# Patient Record
Sex: Male | Born: 1950 | Race: White | Hispanic: No | Marital: Married | Smoking: Current every day smoker
Health system: Southern US, Community
[De-identification: ages and names within clinical notes are randomized; demographics above are authoritative.]

## PROBLEM LIST (undated history)

## (undated) DIAGNOSIS — I10 Essential (primary) hypertension: Secondary | ICD-10-CM

## (undated) DIAGNOSIS — I219 Acute myocardial infarction, unspecified: Secondary | ICD-10-CM

## (undated) DIAGNOSIS — Z5189 Encounter for other specified aftercare: Secondary | ICD-10-CM

---

## 2016-02-27 ENCOUNTER — Emergency Department (HOSPITAL_COMMUNITY): Payer: Medicare Other

## 2016-02-27 ENCOUNTER — Encounter (HOSPITAL_COMMUNITY): Payer: Self-pay | Admitting: *Deleted

## 2016-02-27 ENCOUNTER — Inpatient Hospital Stay (HOSPITAL_COMMUNITY)
Admission: EM | Admit: 2016-02-27 | Discharge: 2016-03-29 | DRG: 393 | Disposition: E | Payer: Medicare Other | Attending: Family Medicine | Admitting: Family Medicine

## 2016-02-27 DIAGNOSIS — D500 Iron deficiency anemia secondary to blood loss (chronic): Secondary | ICD-10-CM | POA: Diagnosis present

## 2016-02-27 DIAGNOSIS — R112 Nausea with vomiting, unspecified: Secondary | ICD-10-CM | POA: Diagnosis present

## 2016-02-27 DIAGNOSIS — Z66 Do not resuscitate: Secondary | ICD-10-CM | POA: Diagnosis present

## 2016-02-27 DIAGNOSIS — J9621 Acute and chronic respiratory failure with hypoxia: Secondary | ICD-10-CM | POA: Diagnosis present

## 2016-02-27 DIAGNOSIS — R198 Other specified symptoms and signs involving the digestive system and abdomen: Secondary | ICD-10-CM

## 2016-02-27 DIAGNOSIS — K559 Vascular disorder of intestine, unspecified: Secondary | ICD-10-CM | POA: Diagnosis present

## 2016-02-27 DIAGNOSIS — F329 Major depressive disorder, single episode, unspecified: Secondary | ICD-10-CM | POA: Diagnosis present

## 2016-02-27 DIAGNOSIS — N183 Chronic kidney disease, stage 3 unspecified: Secondary | ICD-10-CM | POA: Diagnosis present

## 2016-02-27 DIAGNOSIS — R680 Hypothermia, not associated with low environmental temperature: Secondary | ICD-10-CM | POA: Diagnosis present

## 2016-02-27 DIAGNOSIS — K922 Gastrointestinal hemorrhage, unspecified: Secondary | ICD-10-CM | POA: Diagnosis present

## 2016-02-27 DIAGNOSIS — K921 Melena: Secondary | ICD-10-CM | POA: Diagnosis present

## 2016-02-27 DIAGNOSIS — R7989 Other specified abnormal findings of blood chemistry: Secondary | ICD-10-CM

## 2016-02-27 DIAGNOSIS — F29 Unspecified psychosis not due to a substance or known physiological condition: Secondary | ICD-10-CM | POA: Diagnosis present

## 2016-02-27 DIAGNOSIS — E871 Hypo-osmolality and hyponatremia: Secondary | ICD-10-CM | POA: Diagnosis present

## 2016-02-27 DIAGNOSIS — I469 Cardiac arrest, cause unspecified: Secondary | ICD-10-CM | POA: Diagnosis not present

## 2016-02-27 DIAGNOSIS — I129 Hypertensive chronic kidney disease with stage 1 through stage 4 chronic kidney disease, or unspecified chronic kidney disease: Secondary | ICD-10-CM | POA: Diagnosis present

## 2016-02-27 DIAGNOSIS — E872 Acidosis, unspecified: Secondary | ICD-10-CM | POA: Diagnosis present

## 2016-02-27 DIAGNOSIS — N179 Acute kidney failure, unspecified: Secondary | ICD-10-CM | POA: Diagnosis present

## 2016-02-27 DIAGNOSIS — E861 Hypovolemia: Secondary | ICD-10-CM

## 2016-02-27 DIAGNOSIS — R231 Pallor: Secondary | ICD-10-CM | POA: Diagnosis present

## 2016-02-27 DIAGNOSIS — I252 Old myocardial infarction: Secondary | ICD-10-CM

## 2016-02-27 DIAGNOSIS — R140 Abdominal distension (gaseous): Secondary | ICD-10-CM | POA: Diagnosis not present

## 2016-02-27 DIAGNOSIS — I959 Hypotension, unspecified: Secondary | ICD-10-CM | POA: Diagnosis present

## 2016-02-27 DIAGNOSIS — R571 Hypovolemic shock: Secondary | ICD-10-CM | POA: Diagnosis present

## 2016-02-27 DIAGNOSIS — R1084 Generalized abdominal pain: Secondary | ICD-10-CM | POA: Insufficient documentation

## 2016-02-27 DIAGNOSIS — Z6826 Body mass index (BMI) 26.0-26.9, adult: Secondary | ICD-10-CM

## 2016-02-27 DIAGNOSIS — R0602 Shortness of breath: Secondary | ICD-10-CM | POA: Diagnosis present

## 2016-02-27 DIAGNOSIS — K920 Hematemesis: Secondary | ICD-10-CM | POA: Diagnosis present

## 2016-02-27 DIAGNOSIS — Z888 Allergy status to other drugs, medicaments and biological substances status: Secondary | ICD-10-CM

## 2016-02-27 DIAGNOSIS — E162 Hypoglycemia, unspecified: Secondary | ICD-10-CM | POA: Diagnosis not present

## 2016-02-27 DIAGNOSIS — R739 Hyperglycemia, unspecified: Secondary | ICD-10-CM | POA: Diagnosis present

## 2016-02-27 DIAGNOSIS — Z8679 Personal history of other diseases of the circulatory system: Secondary | ICD-10-CM

## 2016-02-27 DIAGNOSIS — Z9981 Dependence on supplemental oxygen: Secondary | ICD-10-CM

## 2016-02-27 DIAGNOSIS — K631 Perforation of intestine (nontraumatic): Principal | ICD-10-CM | POA: Diagnosis present

## 2016-02-27 DIAGNOSIS — Z79899 Other long term (current) drug therapy: Secondary | ICD-10-CM

## 2016-02-27 DIAGNOSIS — J449 Chronic obstructive pulmonary disease, unspecified: Secondary | ICD-10-CM | POA: Diagnosis present

## 2016-02-27 DIAGNOSIS — F17210 Nicotine dependence, cigarettes, uncomplicated: Secondary | ICD-10-CM | POA: Diagnosis present

## 2016-02-27 DIAGNOSIS — G43909 Migraine, unspecified, not intractable, without status migrainosus: Secondary | ICD-10-CM | POA: Diagnosis present

## 2016-02-27 DIAGNOSIS — D62 Acute posthemorrhagic anemia: Secondary | ICD-10-CM | POA: Diagnosis present

## 2016-02-27 DIAGNOSIS — A419 Sepsis, unspecified organism: Secondary | ICD-10-CM | POA: Diagnosis present

## 2016-02-27 DIAGNOSIS — R945 Abnormal results of liver function studies: Secondary | ICD-10-CM

## 2016-02-27 DIAGNOSIS — E875 Hyperkalemia: Secondary | ICD-10-CM | POA: Diagnosis present

## 2016-02-27 DIAGNOSIS — E669 Obesity, unspecified: Secondary | ICD-10-CM | POA: Diagnosis present

## 2016-02-27 DIAGNOSIS — N289 Disorder of kidney and ureter, unspecified: Secondary | ICD-10-CM

## 2016-02-27 DIAGNOSIS — F419 Anxiety disorder, unspecified: Secondary | ICD-10-CM | POA: Diagnosis present

## 2016-02-27 DIAGNOSIS — N189 Chronic kidney disease, unspecified: Secondary | ICD-10-CM

## 2016-02-27 DIAGNOSIS — R000 Tachycardia, unspecified: Secondary | ICD-10-CM | POA: Diagnosis present

## 2016-02-27 DIAGNOSIS — K659 Peritonitis, unspecified: Secondary | ICD-10-CM | POA: Diagnosis present

## 2016-02-27 HISTORY — DX: Essential (primary) hypertension: I10

## 2016-02-27 HISTORY — DX: Acute myocardial infarction, unspecified: I21.9

## 2016-02-27 HISTORY — DX: Encounter for other specified aftercare: Z51.89

## 2016-02-27 LAB — CBC
HEMATOCRIT: 35.2 % — AB (ref 39.0–52.0)
Hemoglobin: 11.6 g/dL — ABNORMAL LOW (ref 13.0–17.0)
MCH: 29.9 pg (ref 26.0–34.0)
MCHC: 33 g/dL (ref 30.0–36.0)
MCV: 90.7 fL (ref 78.0–100.0)
Platelets: 334 10*3/uL (ref 150–400)
RBC: 3.88 MIL/uL — ABNORMAL LOW (ref 4.22–5.81)
RDW: 13.1 % (ref 11.5–15.5)
WBC: 18.2 10*3/uL — ABNORMAL HIGH (ref 4.0–10.5)

## 2016-02-27 LAB — COMPREHENSIVE METABOLIC PANEL
ALK PHOS: 131 U/L — AB (ref 38–126)
ALT: 13 U/L — AB (ref 17–63)
AST: 27 U/L (ref 15–41)
Albumin: 2.9 g/dL — ABNORMAL LOW (ref 3.5–5.0)
Anion gap: 27 — ABNORMAL HIGH (ref 5–15)
BILIRUBIN TOTAL: 0.4 mg/dL (ref 0.3–1.2)
BUN: 43 mg/dL — ABNORMAL HIGH (ref 6–20)
CALCIUM: 11.8 mg/dL — AB (ref 8.9–10.3)
CHLORIDE: 91 mmol/L — AB (ref 101–111)
CO2: 10 mmol/L — ABNORMAL LOW (ref 22–32)
CREATININE: 4.07 mg/dL — AB (ref 0.61–1.24)
GFR, EST AFRICAN AMERICAN: 17 mL/min — AB (ref >60)
GFR, EST NON AFRICAN AMERICAN: 14 mL/min — AB (ref >60)
Glucose, Bld: 234 mg/dL — ABNORMAL HIGH (ref 65–99)
Potassium: 4.2 mmol/L (ref 3.5–5.1)
Sodium: 128 mmol/L — ABNORMAL LOW (ref 135–145)
TOTAL PROTEIN: 6.9 g/dL (ref 6.5–8.1)

## 2016-02-27 LAB — I-STAT CHEM 8, ED
BUN: 38 mg/dL — ABNORMAL HIGH (ref 6–20)
CALCIUM ION: 1.49 mmol/L — AB (ref 1.15–1.40)
Chloride: 96 mmol/L — ABNORMAL LOW (ref 101–111)
Creatinine, Ser: 4.1 mg/dL — ABNORMAL HIGH (ref 0.61–1.24)
GLUCOSE: 219 mg/dL — AB (ref 65–99)
HCT: 36 % — ABNORMAL LOW (ref 39.0–52.0)
HEMOGLOBIN: 12.2 g/dL — AB (ref 13.0–17.0)
Potassium: 4.2 mmol/L (ref 3.5–5.1)
Sodium: 127 mmol/L — ABNORMAL LOW (ref 135–145)
TCO2: 12 mmol/L (ref 0–100)

## 2016-02-27 LAB — POCT I-STAT 4, (NA,K, GLUC, HGB,HCT)
Glucose, Bld: 222 mg/dL — ABNORMAL HIGH (ref 65–99)
HCT: 42 % (ref 39.0–52.0)
Hemoglobin: 14.3 g/dL (ref 13.0–17.0)
Potassium: 5.9 mmol/L — ABNORMAL HIGH (ref 3.5–5.1)
SODIUM: 125 mmol/L — AB (ref 135–145)

## 2016-02-27 LAB — TROPONIN I

## 2016-02-27 LAB — APTT: APTT: 37 s — AB (ref 24–36)

## 2016-02-27 LAB — I-STAT TROPONIN, ED: Troponin i, poc: 0 ng/mL (ref 0.00–0.08)

## 2016-02-27 MED ORDER — SODIUM CHLORIDE 0.9 % IV SOLN: INTRAVENOUS | Status: DC

## 2016-02-27 MED ORDER — ACETAMINOPHEN 325 MG PO TABS: 650.0000 mg | ORAL_TABLET | Freq: Four times a day (QID) | ORAL | Status: DC | PRN

## 2016-02-27 MED ORDER — SODIUM BICARBONATE 8.4 % IV SOLN
INTRAVENOUS | Status: DC
  Administered 2016-02-27 – 2016-02-28 (×2): via INTRAVENOUS
  Filled 2016-02-27 (×5): qty 1000

## 2016-02-27 MED ORDER — SODIUM CHLORIDE 0.9 % IV BOLUS (SEPSIS)
1000.0000 mL | Freq: Once | INTRAVENOUS | Status: AC
  Administered 2016-02-27: 1000 mL via INTRAVENOUS

## 2016-02-27 MED ORDER — MORPHINE SULFATE (PF) 2 MG/ML IV SOLN
1.0000 mg | INTRAVENOUS | Status: DC | PRN
  Administered 2016-02-27: 1 mg via INTRAVENOUS
  Filled 2016-02-27: qty 1

## 2016-02-27 MED ORDER — OLANZAPINE 5 MG PO TABS
10.0000 mg | ORAL_TABLET | Freq: Two times a day (BID) | ORAL | Status: DC
  Administered 2016-02-27: 10 mg via ORAL
  Filled 2016-02-27: qty 2

## 2016-02-27 MED ORDER — ALPRAZOLAM 0.5 MG PO TABS
0.5000 mg | ORAL_TABLET | Freq: Two times a day (BID) | ORAL | Status: DC | PRN
  Administered 2016-02-28: 0.5 mg via ORAL
  Filled 2016-02-27: qty 1

## 2016-02-27 MED ORDER — PIPERACILLIN-TAZOBACTAM 3.375 G IVPB
3.3750 g | Freq: Three times a day (TID) | INTRAVENOUS | Status: DC
  Administered 2016-02-27 – 2016-02-28 (×2): 3.375 g via INTRAVENOUS
  Filled 2016-02-27 (×2): qty 50

## 2016-02-27 MED ORDER — VANCOMYCIN HCL IN DEXTROSE 1-5 GM/200ML-% IV SOLN
1000.0000 mg | INTRAVENOUS | Status: DC
  Administered 2016-02-27: 1000 mg via INTRAVENOUS
  Filled 2016-02-27: qty 200

## 2016-02-27 MED ORDER — BUSPIRONE HCL 5 MG PO TABS
30.0000 mg | ORAL_TABLET | Freq: Two times a day (BID) | ORAL | Status: DC
  Administered 2016-02-27: 30 mg via ORAL
  Filled 2016-02-27: qty 6

## 2016-02-27 MED ORDER — FAMOTIDINE IN NACL 20-0.9 MG/50ML-% IV SOLN
20.0000 mg | Freq: Two times a day (BID) | INTRAVENOUS | Status: DC
  Administered 2016-02-27: 20 mg via INTRAVENOUS
  Filled 2016-02-27: qty 50

## 2016-02-27 MED ORDER — SODIUM BICARBONATE 8.4 % IV SOLN
INTRAVENOUS | Status: AC
  Filled 2016-02-27: qty 150

## 2016-02-27 MED ORDER — ACETAMINOPHEN 650 MG RE SUPP: 650.0000 mg | Freq: Four times a day (QID) | RECTAL | Status: DC | PRN

## 2016-02-27 NOTE — ED Provider Notes (Signed)
[facility]-EMER[facility]ENCY DEPT Provider Note   CSN: [id number] Arrival date & time: 03/19/2016  1752     History   Chief Complaint Chief Complaint  Patient presents with  . Shortness of Breath    HPI James Delgado is a 65 y.o. male.  HPI  Patient presents via EMS with concerns of weakness, dyspnea. Patient acknowledges multiple medical issues, include a COPD, but cannot specify the others. He states that he uses oxygen at home. Seems as though the past few days the patient has had increasing weakness, without clear precipitant. Patient eventually states that he received a blood transfusion at another facility 2 weeks ago, has been seen there once in the interim, discharged home, in spite of having persistent weakness. Patient initially denies new diarrhea, melena, hematemesis, vomiting, but eventually tells nursing that he may have had bloody emesis recently. He does complain of discomfort throughout his habitus, primarily in his abdomen, head, neck, described as sore. Patient states that he takes all of his medication as directed, though it is unclear what his medication regimen is.   Past Medical History:  Diagnosis Date  . Blood transfusion without reported diagnosis   . Heart attack   . Hypertension     There are no active problems to display for this patient.   History reviewed. No pertinent surgical history.     Home Medications    Prior to Admission medications   Medication Sig Start Date End Date Taking? Authorizing Provider  ALPRAZolam Prudy Feeler) 0.5 MG tablet Take 0.5 mg by mouth 2 (two) times daily as needed. 01/29/16   Historical Provider, MD  amLODipine-benazepril (LOTREL) 5-10 MG capsule Take 1 capsule by mouth daily. 12/29/15   Historical Provider, MD  busPIRone (BUSPAR) 15 MG tablet Take 15 mg by mouth 4 (four) times daily. 02/14/16   Historical Provider, MD  ferrous sulfate 325 (65 FE) MG tablet Take 325 mg by mouth 2 (two) times daily. 02/08/16   Historical Provider, MD   gemfibrozil (LOPID) 600 MG tablet Take 600 mg by mouth 2 (two) times daily. 01/17/16   Historical Provider, MD  HYDROcodone-acetaminophen (NORCO/VICODIN) 5-325 MG tablet Take 1 tablet by mouth 3 (three) times daily as needed. 02/16/16   Historical Provider, MD  OLANZapine (ZYPREXA) 10 MG tablet Take 20 mg by mouth daily. 01/17/16   Historical Provider, MD  propranolol (INDERAL) 10 MG tablet Take 10 mg by mouth 4 (four) times daily. 12/29/15   Historical Provider, MD  tiZANidine (ZANAFLEX) 4 MG tablet Take 4 mg by mouth 2 (two) times daily as needed. 12/29/15   Historical Provider, MD  traMADol (ULTRAM) 50 MG tablet Take 1 tablet by mouth 3 (three) times daily as needed. 02/04/16   Historical Provider, MD    Family History No family history on file.  Social History Social History  Substance Use Topics  . Smoking status: Current Every Day Smoker    Packs/day: 1.00    Types: Cigarettes  . Smokeless tobacco: Not on file  . Alcohol use No     Allergies   Review of patient's allergies indicates no known allergies.   Review of Systems Review of Systems  Constitutional:       Per HPI, otherwise negative  HENT:       Per HPI, otherwise negative  Respiratory:       Per HPI, otherwise negative  Cardiovascular:       Per HPI, otherwise negative  Gastrointestinal: Positive for nausea and vomiting. Negative for anal bleeding and  blood in stool.  Endocrine:       Negative aside from HPI  Genitourinary:       Neg aside from HPI   Musculoskeletal:       Per HPI, otherwise negative  Skin: Positive for pallor.  Neurological: Positive for weakness. Negative for syncope.     Physical Exam Updated Vital Signs BP (!) 69/40 (BP Location: Right Arm)   Pulse 109   Resp (!) 32   Ht 6\' 1"  (1.854 m)   Wt 195 lb (88.5 kg)   BMI 25.73 kg/m   Physical Exam  Constitutional: He is oriented to person, place, and time. He appears distressed.  Unwell appearing elderly male, obese, sitting upright in  bed, with increased work of breathing  HENT:  Head: Normocephalic and atraumatic.  Eyes: Conjunctivae and EOM are normal.  Cardiovascular: Regular rhythm.  Tachycardia present.   Pulmonary/Chest: No stridor. Tachypnea noted. He is in respiratory distress. He has decreased breath sounds.  Abdominal: He exhibits no distension.  Musculoskeletal: He exhibits no edema.  Neurological: He is alert and oriented to person, place, and time. He is not disoriented. He displays no tremor. He exhibits normal muscle tone. He displays no seizure activity.  Skin: Skin is warm. He is diaphoretic. There is pallor.  Psychiatric: He has a normal mood and affect.  Nursing note and vitals reviewed.    ED Treatments / Results  Labs (all labs ordered are listed, but only abnormal results are displayed) Labs Reviewed  CBC - Abnormal; Notable for the following:       Result Value   WBC 18.2 (*)    RBC 3.88 (*)    Hemoglobin 11.6 (*)    HCT 35.2 (*)    All other components within normal limits  APTT - Abnormal; Notable for the following:    aPTT 37 (*)    All other components within normal limits  I-STAT CHEM 8, ED - Abnormal; Notable for the following:    Sodium 127 (*)    Chloride 96 (*)    BUN 38 (*)    Creatinine, Ser 4.10 (*)    Glucose, Bld 219 (*)    Calcium, Ion 1.49 (*)    Hemoglobin 12.2 (*)    HCT 36.0 (*)    All other components within normal limits  POCT I-STAT 4, (NA,K, GLUC, HGB,HCT) - Abnormal; Notable for the following:    Sodium 125 (*)    Potassium 5.9 (*)    Glucose, Bld 222 (*)    All other components within normal limits  COMPREHENSIVE METABOLIC PANEL  TROPONIN I  I-STAT TROPOININ, ED    EKG  EKG Interpretation  Date/Time:  Sunday February 27 2016 17:54:36 EDT Ventricular Rate:  112 PR Interval:    QRS Duration: 106 QT Interval:  343 QTC Calculation: 469 R Axis:   115 Text Interpretation:  Sinus tachycardia Consider left atrial enlargement T wave abnormality  Non-specific intra-ventricular conduction delay Artifact Abnormal ekg Confirmed by Gerhard MunchLOCKWOOD, Kathy Wahid  MD [phone]) on 03/23/2016 6:21:48 PM       Radiology Dg Chest Port 1 View  Result Date: 03/18/2016 CLINICAL DATA:  Chest pain EXAM: PORTABLE CHEST 1 VIEW COMPARISON:  02/21/2016 FINDINGS: Cardiac shadow is mildly accentuated by the portable technique. Thickening of the minor fissure is noted on the right consistent with a small amount of pleural fluid. There is likely some right basilar atelectasis present as well. These changes are new from the prior exam. No other  focal abnormality is seen. IMPRESSION: New right basilar atelectasis and thickening of the minor fissure. Electronically Signed   By: Alcide Clever M.D.   On: 03/15/2016 18:30    Procedures Procedures (including critical care time)  Medications Ordered in ED Medications  sodium chloride 0.9 % bolus 1,000 mL (1,000 mLs Intravenous New Bag/Given 2016-03-15 1813)     Initial Impression / Assessment and Plan / ED Course  I have reviewed the triage vital signs and the nursing notes.  Pertinent labs & imaging results that were available during my care of the patient were reviewed by me and considered in my medical decision making (see chart for details).  Clinical Course    Initial blood pressure 60/40, fluids started, supplemental oxygen and continued. Attempting to receive records from the patient's other facility.  7:34 PM Blood pressure now substantially improved, 100 systolic. Initial labs notable for renal dysfunction, creatinine of 4, potassium of 5.9, calcium of 12 Patient has no history of renal disease, that he acknowledges. Patient has received IV fluids empirically, will continue to do so.  On repeat exam the patient's color is substantially better, pallor has resolved in his face.  8:26 PM Hospitals records from 4 days ago during evaluation at another facility demonstrate creatinine 1.5, substantiating new renal  insufficiency.   Final Clinical Impressions(s) / ED Diagnoses  Elderly male with multiple medical issues presents with increased work of breathing, describes weakness, fatigue, diffuse discomfort. Patient is initially notably hypotensive, 60/40, pale, diaphoretic, and there is concern for hypovolemic shock, unclear etiology. patient is also hypothermic. Patient received immediate resuscitation with IV fluids, warming blankets. Patient had substantial improvement in his vital signs. However, the patient was unable multiple critical lab abnormalities including creatinine of 4, potassium of greater than 5. Patient required admission for further evaluation, management.  CRITICAL CARE Performed by: Gerhard Munch Total critical care time: 45 minutes Critical care time was exclusive of separately billable procedures and treating other patients. Critical care was necessary to treat or prevent imminent or life-threatening deterioration. Critical care was time spent personally by me on the following activities: development of treatment plan with patient and/or surrogate as well as nursing, discussions with consultants, evaluation of patient's response to treatment, examination of patient, obtaining history from patient or surrogate, ordering and performing treatments and interventions, ordering and review of laboratory studies, ordering and review of radiographic studies, pulse oximetry and re-evaluation of patient's condition.    Gerhard Munch, MD 03-15-2016 2027

## 2016-02-27 NOTE — H&P (Signed)
[facility] History and Physical  James Delgado ZOX:[id number] DOB: [birth year] DOA: March 01, 2016  Referring physician: Dr. Jeraldine Loots, [facility] ED PCP: No primary care provider on file.   Chief Complaint: Dyspnea  HPI: Treyce Spillers is a 65 y.o. male called EMS today and found sitting on floor , O2 sat was 86% improved on 2L [place] O2.  Was at [facility] last week and was told he had a "light heart attack " and a "light stroke".  STill having SOB and headaches, came to [facility] for another opinion.    Notes from [facility] show admit on 9/5- 02/05/16 for anemia, prerenal azotemia, hypoNa+ and hyperkalemia, improved w IVF's and blood transfusion. Cr 2.6 > 1.49. Hb 7.1 > 8.9 after 2u prbc/   Then admitted 9/25 - 02/22/16 for chest pain rule out.  Had neg trop x 3 and normal EKG, WBC 12k, Creat 1.5.  DC'd , to have outpatient stress test.    Now here with anemia, hypoNa+, acute renal failure w creat 4 and Ca 11.8.    Patient says he has been vomiting for 2 weeks.  Vomitus is "black" and smells bad.  No bloody stool or BRB w emesis.  He has brown liquid stained cheeks from vomiting.  Has chest pain and abd pain, "all over" from his throat to his lower abd in the midline.  He smokes, denies any nsaid use.  Has been taking Tums antacid pills about 6 / day usually but this past two weeks has been taking double.  Denies any hx of ulcers or polyps.    Patient lives w his wife of 30 yrs in [place], [place].  Pt went to [employer], didn't finish , went to work in [employer] x 15 yrs.  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  Never had children.  Has Medicare and Medicaid and one more insurance policy.  NO problem getting medications.  Quit ETOH 27 yrs ago, smokes actively.  Had 4 nervous breakdowns, spent 3 mos at [facility] for each, the last was about 1996.  Takes Zyprexa for this and Xanax prn.  No family here today.  Denies fevers, chills, HA, no hx dysurnia or hematuria, no hx kidney stones.  NO hx  ulcers.  No etoh.    Home meds > xanax, amlod/ benazepril, Buspar, feSO4, Lopid, Norco, Zyprexa, Inderal, ULtram   Past Medical History  Past Medical History:  Diagnosis Date  . Blood transfusion without reported diagnosis   . Heart attack   . Hypertension    Past Surgical History History reviewed. No pertinent surgical history. Family History History reviewed. No pertinent family history. Social History  reports that he has been smoking Cigarettes.  He has been smoking about 1.00 pack per day. He has never used smokeless tobacco. He reports that he does not drink alcohol or use drugs. Allergies  Allergies  Allergen Reactions  . Seroquel [Quetiapine Fumarate] Other (See Comments)    Patient has hallucinations   Home medications Prior to Admission medications   Medication Sig Start Date End Date Taking? Authorizing Provider  ALPRAZolam Prudy Feeler) 0.5 MG tablet Take 0.5 mg by mouth 2 (two) times daily as needed. 01/29/16   Historical Provider, MD  amLODipine-benazepril (LOTREL) 5-10 MG capsule Take 1 capsule by mouth daily. 12/29/15   Historical Provider, MD  busPIRone (BUSPAR) 15 MG tablet Take 15 mg by mouth 4 (four) times daily. 02/14/16   Historical Provider, MD  ferrous sulfate 325 (65 FE) MG  tablet Take 325 mg by mouth 2 (two) times daily. 02/08/16   Historical Provider, MD  gemfibrozil (LOPID) 600 MG tablet Take 600 mg by mouth 2 (two) times daily. 01/17/16   Historical Provider, MD  HYDROcodone-acetaminophen (NORCO/VICODIN) 5-325 MG tablet Take 1 tablet by mouth 3 (three) times daily as needed. 02/16/16   Historical Provider, MD  OLANZapine (ZYPREXA) 10 MG tablet Take 20 mg by mouth daily. 01/17/16   Historical Provider, MD  traMADol (ULTRAM) 50 MG tablet Take 1 tablet by mouth 3 (three) times daily as needed. 02/04/16   Historical Provider, MD   Liver Function Tests  Recent Labs Lab 03/19/2016 1837  AST 27  ALT 13*  ALKPHOS 131*  BILITOT 0.4  PROT 6.9  ALBUMIN 2.9*   No results  for input(s): LIPASE, AMYLASE in the last 168 hours. CBC  Recent Labs Lab 03/19/2016 1825 03/22/2016 1837 03/10/2016 1913  WBC  --  18.2*  --   HGB 14.3 11.6* 12.2*  HCT 42.0 35.2* 36.0*  MCV  --  90.7  --   PLT  --  334  --    Basic Metabolic Panel  Recent Labs Lab 03/28/2016 1825 03/11/2016 1837 03/04/2016 1913  NA 125* 128* 127*  K 5.9* 4.2 4.2  CL  --  91* 96*  CO2  --  10*  --   GLUCOSE 222* 234* 219*  BUN  --  43* 38*  CREATININE  --  4.07* 4.10*  CALCIUM  --  11.8*  --      Vitals:   03/25/2016 1921 03/28/2016 2015 03/17/2016 2040 03/15/2016 2042  BP: 129/95 123/87  133/77  Pulse: 94 101 78 95  Resp: 22 23 (!) 33 25  Temp:  (!) 96.2 F (35.7 C)    TempSrc:  Rectal    SpO2: 99% 99% 95% 100%  Weight:      Height:       Exam: Gen pale older adult male, alert, no distress, looks dry and pale No rash, cyanosis or gangrene Sclera anicteric, throat is quite dry, + with stained cheeks from vomiting coffee ground emesis No jvd or bruits Chest clear bilat RRR no MRG Abd soft ntnd no mass or ascites +bs, no hsm GU normal male MS no joint effusions or deformity Ext no LE or UE edema / no wounds or ulcers, skin turgor is poor Neuro is alert, Ox 3 , nf   Na 127  K 4.2  Cl 96 CO2 10  BUN 38  Cr 4.07  Ca 11.8 Alb 2.9  Adj Ca 12.6   Tprot 6.9  AST 27 ALT 13  tbili 0.4  Trop < 0.03   WBC 18K   Hb 11.6   plt 334  Glucose 224  EKG (independ reviewed) > Sinus tachycardia  Consider left atrial enlargement T wave abnormality Non-specific intra-ventricular conduction delay CXR (independ reviewed) > New right basilar atelectasis and thickening of the minor fissure.   Assessment: 1. Nausea /vomiting/ coffee ground emesis/ abd pain - not anemic.  Plan admit, start IV PPI, keep on clear liquids and consult GI in am. May need eval of upper tract for ulcer/ gastritis, etc.  Needs SDU admit due to hypotension, give lots of fluids, IV abx after cultures in case of sepsis.   2. Acute renal  failure - due to #4 and #5. Check [place] and UA.  3. CKD - baseline creat is 1.5, CKD 3 4. Vol depletion 5. Hypercalcemia - probably from CaCO3,  taking high doses of CaCO3 for stomach discomfort, less likely malignancy. DC TUms, give lots of saline, f/u Ca levels, further w/u if not improving.  6. Hypotension - doesn't appear septic, but temp is low 96 and WBC is high.  Plan blood and urine cx's and start empiric abx.  CXR is negative , no resp symptoms. 7. Chron psychosis/ anxiety - cont Zyprexa, prn xanax, BUspar 8. Migraines - on inderal 9. HTN - on CCB/ ACEi combo, will hold both 10. Metabolic acidosis - from renal failure, +AG, +ketosis from starvation. Will start bicarb gtt   Plan - as above     Daci Stubbe D [facility] Pager [phone]   If 7PM-7AM, please contact night-coverage [email or web address] Password [email or web address] 03/07/2016, 9:53 PM

## 2016-02-27 NOTE — ED Notes (Signed)
3rd floor called requesting that I give report to the ICU nurse as the pt will be moving to step down unit. This nurse called Shanda BumpsJessica, RN and gave report. Staff on 3rd floor reports that they will transport pt from 3rd floor to ICU if I called report.

## 2016-02-27 NOTE — ED Notes (Signed)
Pt refusing to leave the bear hugger on.

## 2016-02-27 NOTE — H&P (Deleted)
[facility] History and Physical  James Delgado EAV:[id number] DOB: Apr 04, 1951 DOA: 03/10/2016  Referring physician: Dr. Jeraldine Loots, [facility] ED PCP: No primary care provider on file.   Chief Complaint: Dyspnea  HPI: James Delgado is a 65 y.o. male called EMS today and found sitting on floor , O2 sat was 86% improved on 2L [place] O2.  Was at [facility] hosp last week and was told he had a "light heart attack " and a "light stroke".  STill having SOB and headaches, came to [facility] for another opinion.    Notes from [facility] show admit on 9/5- 02/05/16 for anemia, prerenal azotemia, hypoNa+ and hyperkalemia, improved w IVF's and blood transfusion. Cr 2.6 > 1.49. Hb 7.1 > 8.9 after 2u prbc/   Then admitted 9/25 - 02/22/16 for chest pain rule out.  Had neg trop x 3 and normal EKG, WBC 12k, Creat 1.5.  DC'd , to have outpatient stress test.    Now here with anemia, hypoNa+, acute renal failure w creat 4 and Ca 11.8.    Patient says he has been vomiting for 2 weeks.  Vomitus is "black" and smells bad.  No bloody stool or BRB w emesis.  He has brown liquid stained cheeks from vomiting.  Has chest pain and abd pain, "all over" from his throat to his lower abd in the midline.  He smokes, denies any nsaid use.  Has been taking Tums antacid pills about 6 / day usually but this past two weeks has been taking double.  Denies any hx of ulcers or polyps.    Patient lives w his wife of 30 yrs in [place], [place].  Pt went to [employer], didn't finish , went to work in [employer] x 15 yrs.  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  Never had children.  Has Medicare and Medicaid and one more insurance policy.  NO problem getting medications.  Quit ETOH 27 yrs ago, smokes actively.  Had 4 nervous breakdowns, spent 3 mos at [facility] for each, the last was about 1996.  Takes Zyprexa for this and Xanax prn.  No family here today.  Denies fevers, chills, HA, no hx dysurnia or hematuria, no hx kidney stones.  NO hx  ulcers.  No etoh.    Home meds > xanax, amlod/ benazepril, Buspar, feSO4, Lopid, Norco, Zyprexa, Inderal, ULtram   Past Medical History  Past Medical History:  Diagnosis Date  . Blood transfusion without reported diagnosis   . Heart attack   . Hypertension    Past Surgical History History reviewed. No pertinent surgical history. Family History No family history on file. Social History  reports that he has been smoking Cigarettes.  He has been smoking about 1.00 pack per day. He does not have any smokeless tobacco history on file. He reports that he does not drink alcohol or use drugs. Allergies No Known Allergies Home medications Prior to Admission medications   Medication Sig Start Date End Date Taking? Authorizing Provider  ALPRAZolam Prudy Feeler) 0.5 MG tablet Take 0.5 mg by mouth 2 (two) times daily as needed. 01/29/16   Historical Provider, MD  amLODipine-benazepril (LOTREL) 5-10 MG capsule Take 1 capsule by mouth daily. 12/29/15   Historical Provider, MD  busPIRone (BUSPAR) 15 MG tablet Take 15 mg by mouth 4 (four) times daily. 02/14/16   Historical Provider, MD  ferrous sulfate 325 (65 FE) MG tablet Take 325 mg by mouth 2 (two) times daily. 02/08/16   Historical Provider,  MD  gemfibrozil (LOPID) 600 MG tablet Take 600 mg by mouth 2 (two) times daily. 01/17/16   Historical Provider, MD  HYDROcodone-acetaminophen (NORCO/VICODIN) 5-325 MG tablet Take 1 tablet by mouth 3 (three) times daily as needed. 02/16/16   Historical Provider, MD  OLANZapine (ZYPREXA) 10 MG tablet Take 20 mg by mouth daily. 01/17/16   Historical Provider, MD  traMADol (ULTRAM) 50 MG tablet Take 1 tablet by mouth 3 (three) times daily as needed. 02/04/16   Historical Provider, MD   Liver Function Tests  Recent Labs Lab 03/04/2016 1837  AST 27  ALT 13*  ALKPHOS 131*  BILITOT 0.4  PROT 6.9  ALBUMIN 2.9*   No results for input(s): LIPASE, AMYLASE in the last 168 hours. CBC  Recent Labs Lab 03/17/2016 1825  03/07/2016 1837 03/11/2016 1913  WBC  --  18.2*  --   HGB 14.3 11.6* 12.2*  HCT 42.0 35.2* 36.0*  MCV  --  90.7  --   PLT  --  334  --    Basic Metabolic Panel  Recent Labs Lab 02/29/2016 1825 03/28/2016 1837 03/05/2016 1913  NA 125* 128* 127*  K 5.9* 4.2 4.2  CL  --  91* 96*  CO2  --  10*  --   GLUCOSE 222* 234* 219*  BUN  --  43* 38*  CREATININE  --  4.07* 4.10*  CALCIUM  --  11.8*  --      Vitals:   03/04/2016 1820 03/14/2016 1905 03/03/2016 1915 03/19/2016 1921  BP:  116/73 (!) 98/52 129/95  Pulse:  (!) 139 96 94  Resp:  22 (!) 28 (!) 32  Temp: (S) (!) 96.1 F (35.6 C)     TempSrc: Rectal     SpO2:  96% 99% 99%  Weight:      Height:       Exam: Gen pale older adult male, alert, no distress, looks dry and pale No rash, cyanosis or gangrene Sclera anicteric, throat is quite dry, + with stained cheeks from vomiting coffee ground emesis No jvd or bruits Chest clear bilat RRR no MRG Abd soft ntnd no mass or ascites +bs, no hsm GU normal male MS no joint effusions or deformity Ext no LE or UE edema / no wounds or ulcers, skin turgor is poor Neuro is alert, Ox 3 , nf   Na 127  K 4.2  Cl 96 CO2 10  BUN 38  Cr 4.07  Ca 11.8 Alb 2.9  Adj Ca 12.6   Tprot 6.9  AST 27 ALT 13  tbili 0.4  Trop < 0.03   WBC 18K   Hb 11.6   plt 334  Glucose 224  EKG (independ reviewed) > Sinus tachycardia  Consider left atrial enlargement T wave abnormality Non-specific intra-ventricular conduction delay CXR (independ reviewed) > New right basilar atelectasis and thickening of the minor fissure.   Assessment: 1. Nausea /vomiting/ coffee ground emesis/ abd pain - not anemic.  Plan admit, start IV PPI, keep on clear liquids and consult GI in am. May need eval of upper tract for ulcer/ gastritis, etc.  Needs SDU admit due to hypotension, give lots of fluids, IV abx after cultures in case of sepsis.   2. Acute renal failure - due to #4 and #5. Check US and UA.  3. CKD - baseline creat is 1.5, CKD  3 4. Vol depletion 5. Hypercalcemia - probably from CaCO3, taking high doses of CaCO3 for stomach discomfort, less likely malignancy.  DC TUms, give lots of saline, f/u Ca levels, further w/u if not improving.  6. Hypotension - doesn't appear septic, but temp is low 96 and WBC is high.  Plan blood and urine cx's and start empiric abx.  CXR is negative , no resp symptoms. 7. Chron psychosis/ anxiety - cont Zyprexa, prn xanax, BUspar 8. Migraines - on inderal 9. HTN - on CCB/ ACEi combo, will hold both 10. Metabolic acidosis - from renal failure, +AG, +ketosis from starvation. Will start bicarb gtt   Plan - as above     James Delgado D [facility] Pager [phone]   If 7PM-7AM, please contact night-coverage [email or web address] Password [email or web address] Mar 18, 2016, 8:00 PM

## 2016-02-27 NOTE — ED Triage Notes (Signed)
EMS was called out for SOB. Pt was found to be sitting in the floor messing with his wallet. O2 sat was 86% on RA, EMS placed pt on 2L O2 via [place]. EMS reported no wheezing. Per pt [facility] and was told he had a "light heart attack" and "light stroke". Pt reports his family called EMS because his "heart hurts, short of breath, and both sides of my neck hurt and are swollen".

## 2016-02-27 NOTE — Progress Notes (Signed)
Pharmacy Antibiotic Note  Patricia NettleJames Despain is a 65 y.o. male admitted on 03/16/2016 with sepsis.  Pharmacy has been consulted for vancomycin and Zosyn dosing.  Plan: 1. Vancomycin 1000 mg IV Q24h 2. Zosyn 3.375g IV Q8h infused over 4 hours 3. Will monitor cultures and renal function. Will check vancomycin trough as appropriate.   Height: 6\' 1"  (185.4 cm) Weight: 195 lb (88.5 kg) IBW/kg (Calculated) : 79.9  Temp (24hrs), Avg:96.2 F (35.7 C), Min:96.1 F (35.6 C), Max:96.2 F (35.7 C)   Recent Labs Lab 2015/10/15 1837 2015/10/15 1913  WBC 18.2*  --   CREATININE 4.07* 4.10*    Estimated Creatinine Clearance: 20.8 mL/min (by C-G formula based on SCr of 4.1 mg/dL (H)).    Allergies  Allergen Reactions  . Seroquel [Quetiapine Fumarate] Other (See Comments)    Patient has hallucinations    Antimicrobials this admission: 10/1 vancomycin >>  10/1 Zosyn >>   Dose adjustments this admission: None  Microbiology results: 10/1 UCx:   10/1 MRSA PCR:   Thank you for allowing pharmacy to be a part of this patient's care.  Lenore MannerHolcombe, Alvetta Hidrogo [place] 03/26/2016 11:09 PM

## 2016-02-28 ENCOUNTER — Inpatient Hospital Stay (HOSPITAL_COMMUNITY): Payer: Medicare Other

## 2016-02-28 DIAGNOSIS — R112 Nausea with vomiting, unspecified: Secondary | ICD-10-CM

## 2016-02-28 DIAGNOSIS — R198 Other specified symptoms and signs involving the digestive system and abdomen: Secondary | ICD-10-CM

## 2016-02-28 DIAGNOSIS — R7989 Other specified abnormal findings of blood chemistry: Secondary | ICD-10-CM

## 2016-02-28 DIAGNOSIS — K559 Vascular disorder of intestine, unspecified: Secondary | ICD-10-CM

## 2016-02-28 DIAGNOSIS — R945 Abnormal results of liver function studies: Secondary | ICD-10-CM

## 2016-02-28 DIAGNOSIS — K920 Hematemesis: Secondary | ICD-10-CM

## 2016-02-28 DIAGNOSIS — A419 Sepsis, unspecified organism: Secondary | ICD-10-CM

## 2016-02-28 DIAGNOSIS — R140 Abdominal distension (gaseous): Secondary | ICD-10-CM

## 2016-02-28 DIAGNOSIS — E861 Hypovolemia: Secondary | ICD-10-CM

## 2016-02-28 DIAGNOSIS — I469 Cardiac arrest, cause unspecified: Secondary | ICD-10-CM

## 2016-02-28 DIAGNOSIS — D500 Iron deficiency anemia secondary to blood loss (chronic): Secondary | ICD-10-CM

## 2016-02-28 DIAGNOSIS — I959 Hypotension, unspecified: Secondary | ICD-10-CM

## 2016-02-28 DIAGNOSIS — N179 Acute kidney failure, unspecified: Secondary | ICD-10-CM

## 2016-02-28 LAB — LACTIC ACID, PLASMA
LACTIC ACID, VENOUS: 20.5 mmol/L — AB (ref 0.5–1.9)
LACTIC ACID, VENOUS: 15.3 mmol/L — AB (ref 0.5–1.9)
LACTIC ACID, VENOUS: 11.5 mmol/L — AB (ref 0.5–1.9)

## 2016-02-28 LAB — CBC
HCT: 27.8 % — ABNORMAL LOW (ref 39.0–52.0)
HCT: 24.9 % — ABNORMAL LOW (ref 39.0–52.0)
HEMATOCRIT: 34.3 % — AB (ref 39.0–52.0)
HEMOGLOBIN: 11.6 g/dL — AB (ref 13.0–17.0)
HEMOGLOBIN: 7.9 g/dL — AB (ref 13.0–17.0)
Hemoglobin: 8.7 g/dL — ABNORMAL LOW (ref 13.0–17.0)
MCH: 29.6 pg (ref 26.0–34.0)
MCH: 29.2 pg (ref 26.0–34.0)
MCH: 28.9 pg (ref 26.0–34.0)
MCHC: 33.8 g/dL (ref 30.0–36.0)
MCHC: 31.3 g/dL (ref 30.0–36.0)
MCHC: 31.7 g/dL (ref 30.0–36.0)
MCV: 87.5 fL (ref 78.0–100.0)
MCV: 93.3 fL (ref 78.0–100.0)
MCV: 91.2 fL (ref 78.0–100.0)
PLATELETS: 214 10*3/uL (ref 150–400)
PLATELETS: 160 10*3/uL (ref 150–400)
Platelets: 217 10*3/uL (ref 150–400)
RBC: 3.92 MIL/uL — ABNORMAL LOW (ref 4.22–5.81)
RBC: 2.98 MIL/uL — AB (ref 4.22–5.81)
RBC: 2.73 MIL/uL — AB (ref 4.22–5.81)
RDW: 13.1 % (ref 11.5–15.5)
RDW: 13.5 % (ref 11.5–15.5)
RDW: 13.7 % (ref 11.5–15.5)
WBC: 3.8 10*3/uL — AB (ref 4.0–10.5)
WBC: 7 10*3/uL (ref 4.0–10.5)
WBC: 7 10*3/uL (ref 4.0–10.5)

## 2016-02-28 LAB — BLOOD GAS, ARTERIAL
ACID-BASE DEFICIT: 23.3 mmol/L — AB (ref 0.0–2.0)
Acid-base deficit: 17.2 mmol/L — ABNORMAL HIGH (ref 0.0–2.0)
BICARBONATE: 11.5 mmol/L — AB (ref 20.0–28.0)
Bicarbonate: 6.8 mmol/L — ABNORMAL LOW (ref 20.0–28.0)
DELIVERY SYSTEMS: POSITIVE
Drawn by: 105551
Drawn by: 23534
EXPIRATORY PAP: 6
FIO2: 100
FIO2: 100
INSPIRATORY PAP: 14
O2 Content: 100 L/min
O2 SAT: 98.1 %
O2 Saturation: 99.4 %
PEEP/CPAP: 5 cmH2O
PH ART: 6.86 — AB (ref 7.350–7.450)
PO2 ART: 291 mmHg — AB (ref 83.0–108.0)
RATE: 8 resp/min
RATE: 16 resp/min
VT: 640 mL
pCO2 arterial: 29 mmHg — ABNORMAL LOW (ref 32.0–48.0)
pCO2 arterial: 42.7 mmHg (ref 32.0–48.0)
pH, Arterial: 7.157 — CL (ref 7.350–7.450)
pO2, Arterial: 453 mmHg — ABNORMAL HIGH (ref 83.0–108.0)

## 2016-02-28 LAB — PROCALCITONIN: Procalcitonin: >175 ng/mL

## 2016-02-28 LAB — COMPREHENSIVE METABOLIC PANEL
ALBUMIN: 2.7 g/dL — AB (ref 3.5–5.0)
ALT: 127 U/L — ABNORMAL HIGH (ref 17–63)
AST: 149 U/L — AB (ref 15–41)
Alkaline Phosphatase: 103 U/L (ref 38–126)
Anion gap: 18 — ABNORMAL HIGH (ref 5–15)
BUN: 50 mg/dL — AB (ref 6–20)
CHLORIDE: 94 mmol/L — AB (ref 101–111)
CO2: 16 mmol/L — ABNORMAL LOW (ref 22–32)
Calcium: 11.2 mg/dL — ABNORMAL HIGH (ref 8.9–10.3)
Creatinine, Ser: 4.39 mg/dL — ABNORMAL HIGH (ref 0.61–1.24)
GFR calc Af Amer: 15 mL/min — ABNORMAL LOW (ref >60)
GFR calc non Af Amer: 13 mL/min — ABNORMAL LOW (ref >60)
GLUCOSE: 97 mg/dL (ref 65–99)
POTASSIUM: 4.6 mmol/L (ref 3.5–5.1)
Sodium: 128 mmol/L — ABNORMAL LOW (ref 135–145)
Total Bilirubin: 0.7 mg/dL (ref 0.3–1.2)
Total Protein: 6.2 g/dL — ABNORMAL LOW (ref 6.5–8.1)

## 2016-02-28 LAB — BASIC METABOLIC PANEL
Anion gap: 28 — ABNORMAL HIGH (ref 5–15)
Anion gap: 22 — ABNORMAL HIGH (ref 5–15)
BUN: 54 mg/dL — AB (ref 6–20)
BUN: 56 mg/dL — AB (ref 6–20)
CHLORIDE: 91 mmol/L — AB (ref 101–111)
CO2: 12 mmol/L — ABNORMAL LOW (ref 22–32)
CO2: 12 mmol/L — ABNORMAL LOW (ref 22–32)
CREATININE: 5.02 mg/dL — AB (ref 0.61–1.24)
CREATININE: 5.1 mg/dL — AB (ref 0.61–1.24)
Calcium: 10.7 mg/dL — ABNORMAL HIGH (ref 8.9–10.3)
Calcium: 8.6 mg/dL — ABNORMAL LOW (ref 8.9–10.3)
Chloride: 90 mmol/L — ABNORMAL LOW (ref 101–111)
GFR, EST AFRICAN AMERICAN: 13 mL/min — AB (ref >60)
GFR, EST AFRICAN AMERICAN: 13 mL/min — AB (ref >60)
GFR, EST NON AFRICAN AMERICAN: 11 mL/min — AB (ref >60)
GFR, EST NON AFRICAN AMERICAN: 11 mL/min — AB (ref >60)
Glucose, Bld: 159 mg/dL — ABNORMAL HIGH (ref 65–99)
Glucose, Bld: 388 mg/dL — ABNORMAL HIGH (ref 65–99)
POTASSIUM: 5.5 mmol/L — AB (ref 3.5–5.1)
Potassium: 6.7 mmol/L (ref 3.5–5.1)
SODIUM: 131 mmol/L — AB (ref 135–145)
SODIUM: 124 mmol/L — AB (ref 135–145)

## 2016-02-28 LAB — GLUCOSE, CAPILLARY
GLUCOSE-CAPILLARY: 23 mg/dL — AB (ref 65–99)
GLUCOSE-CAPILLARY: 208 mg/dL — AB (ref 65–99)
GLUCOSE-CAPILLARY: 81 mg/dL (ref 65–99)
GLUCOSE-CAPILLARY: 43 mg/dL — AB (ref 65–99)
GLUCOSE-CAPILLARY: 80 mg/dL (ref 65–99)
Glucose-Capillary: 173 mg/dL — ABNORMAL HIGH (ref 65–99)

## 2016-02-28 LAB — TROPONIN I
TROPONIN I: 0.3 ng/mL — AB (ref <0.03)
Troponin I: 0.75 ng/mL (ref <0.03)

## 2016-02-28 LAB — MAGNESIUM: MAGNESIUM: 3.1 mg/dL — AB (ref 1.7–2.4)

## 2016-02-28 LAB — PROTIME-INR
INR: 1.45
Prothrombin Time: 17.8 seconds — ABNORMAL HIGH (ref 11.4–15.2)

## 2016-02-28 LAB — MRSA PCR SCREENING: MRSA BY PCR: NEGATIVE

## 2016-02-28 MED ORDER — DEXTROSE 50 % IV SOLN
50.0000 mL | Freq: Once | INTRAVENOUS | Status: AC
  Administered 2016-02-28: 50 mL via INTRAVENOUS

## 2016-02-28 MED ORDER — ALBUTEROL SULFATE (2.5 MG/3ML) 0.083% IN NEBU: 10.0000 mg | INHALATION_SOLUTION | Freq: Once | RESPIRATORY_TRACT | Status: DC

## 2016-02-28 MED ORDER — IPRATROPIUM-ALBUTEROL 0.5-2.5 (3) MG/3ML IN SOLN: 3.0000 mL | RESPIRATORY_TRACT | Status: DC | PRN

## 2016-02-28 MED ORDER — ONDANSETRON HCL 4 MG/2ML IJ SOLN: 4.0000 mg | Freq: Four times a day (QID) | INTRAMUSCULAR | Status: DC | PRN

## 2016-02-28 MED ORDER — ACETAMINOPHEN 650 MG RE SUPP
650.0000 mg | Freq: Four times a day (QID) | RECTAL | Status: DC | PRN
Start: 2016-02-28 — End: 2016-02-28

## 2016-02-28 MED ORDER — MORPHINE SULFATE 25 MG/ML IV SOLN
INTRAVENOUS | Status: AC
  Filled 2016-02-28: qty 10

## 2016-02-28 MED ORDER — HALOPERIDOL 0.5 MG PO TABS
0.5000 mg | ORAL_TABLET | ORAL | Status: DC | PRN
  Filled 2016-02-28: qty 1

## 2016-02-28 MED ORDER — GLYCOPYRROLATE 1 MG PO TABS
1.0000 mg | ORAL_TABLET | ORAL | Status: DC | PRN
  Filled 2016-02-28: qty 1

## 2016-02-28 MED ORDER — PIPERACILLIN SOD-TAZOBACTAM SO 2.25 (2-0.25) G IV SOLR
2.2500 g | Freq: Four times a day (QID) | INTRAVENOUS | Status: DC
  Administered 2016-02-28: 2.25 g via INTRAVENOUS
  Filled 2016-02-28 (×4): qty 2.25

## 2016-02-28 MED ORDER — SODIUM CHLORIDE 0.9 % IV BOLUS (SEPSIS)
1000.0000 mL | Freq: Once | INTRAVENOUS | Status: AC
  Administered 2016-02-28: 1000 mL via INTRAVENOUS

## 2016-02-28 MED ORDER — BISACODYL 10 MG RE SUPP: 10.0000 mg | Freq: Every day | RECTAL | Status: DC | PRN

## 2016-02-28 MED ORDER — CHLORHEXIDINE GLUCONATE 0.12% ORAL RINSE (MEDLINE KIT)
15.0000 mL | Freq: Two times a day (BID) | OROMUCOSAL | Status: DC
  Administered 2016-02-28 (×2): 15 mL via OROMUCOSAL

## 2016-02-28 MED ORDER — FENTANYL CITRATE (PF) 100 MCG/2ML IJ SOLN: 100.0000 ug | INTRAMUSCULAR | Status: DC | PRN

## 2016-02-28 MED ORDER — HALOPERIDOL LACTATE 5 MG/ML IJ SOLN: 0.5000 mg | INTRAMUSCULAR | Status: DC | PRN

## 2016-02-28 MED ORDER — POLYVINYL ALCOHOL 1.4 % OP SOLN
1.0000 [drp] | Freq: Four times a day (QID) | OPHTHALMIC | Status: DC | PRN
  Filled 2016-02-28: qty 15

## 2016-02-28 MED ORDER — GLYCOPYRROLATE 0.2 MG/ML IJ SOLN
0.2000 mg | INTRAMUSCULAR | Status: DC | PRN
  Filled 2016-02-28: qty 1

## 2016-02-28 MED ORDER — BIOTENE DRY MOUTH MT LIQD: 15.0000 mL | OROMUCOSAL | Status: DC | PRN

## 2016-02-28 MED ORDER — NOREPINEPHRINE BITARTRATE 1 MG/ML IV SOLN
0.0000 ug/min | INTRAVENOUS | Status: DC
  Administered 2016-02-28: 30 ug/min via INTRAVENOUS
  Administered 2016-02-28: 2 ug/min via INTRAVENOUS
  Filled 2016-02-28 (×2): qty 4

## 2016-02-28 MED ORDER — SODIUM CHLORIDE 0.9 % IV SOLN
8.0000 mg/h | INTRAVENOUS | Status: DC
  Administered 2016-02-28: 8 mg/h via INTRAVENOUS
  Filled 2016-02-28 (×3): qty 80

## 2016-02-28 MED ORDER — NOREPINEPHRINE BITARTRATE 1 MG/ML IV SOLN
0.0000 ug/min | INTRAVENOUS | Status: DC
  Administered 2016-02-28: 30 ug/min via INTRAVENOUS
  Filled 2016-02-28: qty 16

## 2016-02-28 MED ORDER — ACETAMINOPHEN 325 MG PO TABS: 650.0000 mg | ORAL_TABLET | Freq: Four times a day (QID) | ORAL | Status: DC | PRN

## 2016-02-28 MED ORDER — MORPHINE BOLUS VIA INFUSION
2.0000 mg | INTRAVENOUS | Status: DC | PRN
  Filled 2016-02-28: qty 2

## 2016-02-28 MED ORDER — ALBUTEROL SULFATE (2.5 MG/3ML) 0.083% IN NEBU
INHALATION_SOLUTION | RESPIRATORY_TRACT | Status: AC
  Administered 2016-02-28: 5 mg
  Filled 2016-02-28: qty 6

## 2016-02-28 MED ORDER — HALOPERIDOL LACTATE 2 MG/ML PO CONC
0.5000 mg | ORAL | Status: DC | PRN
  Filled 2016-02-28: qty 0.3

## 2016-02-28 MED ORDER — VANCOMYCIN HCL IN DEXTROSE 1-5 GM/200ML-% IV SOLN: 1000.0000 mg | INTRAVENOUS | Status: DC

## 2016-02-28 MED ORDER — SODIUM CHLORIDE 0.9 % IV SOLN
80.0000 mg | Freq: Once | INTRAVENOUS | Status: AC
  Administered 2016-02-28: 80 mg via INTRAVENOUS
  Filled 2016-02-28: qty 80

## 2016-02-28 MED ORDER — MIDAZOLAM HCL 2 MG/2ML IJ SOLN: 2.0000 mg | INTRAMUSCULAR | Status: DC | PRN

## 2016-02-28 MED ORDER — ONDANSETRON 4 MG PO TBDP
4.0000 mg | ORAL_TABLET | Freq: Four times a day (QID) | ORAL | Status: DC | PRN
  Filled 2016-02-28: qty 1

## 2016-02-28 MED ORDER — MORPHINE SULFATE 25 MG/ML IV SOLN
2.0000 mg/h | INTRAVENOUS | Status: DC
  Administered 2016-02-28: 2 mg/h via INTRAVENOUS
  Filled 2016-02-28: qty 10

## 2016-02-28 MED ORDER — SODIUM CHLORIDE 0.9 % IV SOLN: Freq: Once | INTRAVENOUS | Status: DC

## 2016-02-28 MED ORDER — DEXTROSE 50 % IV SOLN
INTRAVENOUS | Status: AC
  Administered 2016-02-28: 20:00:00
  Filled 2016-02-28: qty 50

## 2016-02-28 MED ORDER — BUMETANIDE 0.25 MG/ML IJ SOLN: 1.0000 mg | Freq: Every day | INTRAMUSCULAR | Status: DC

## 2016-02-28 MED ORDER — ORAL CARE MOUTH RINSE
15.0000 mL | OROMUCOSAL | Status: DC
  Administered 2016-02-28 (×4): 15 mL via OROMUCOSAL

## 2016-02-28 MED ORDER — PANTOPRAZOLE SODIUM 40 MG IV SOLR: 40.0000 mg | Freq: Two times a day (BID) | INTRAVENOUS | Status: DC

## 2016-02-28 MED ORDER — ALBUTEROL SULFATE (2.5 MG/3ML) 0.083% IN NEBU
2.5000 mg | INHALATION_SOLUTION | RESPIRATORY_TRACT | Status: DC | PRN
Start: 2016-02-28 — End: 2016-02-29

## 2016-02-29 MED FILL — Medication: Qty: 1 | Status: AC

## 2016-02-29 NOTE — Progress Notes (Signed)
[facility] P[facility]S REMAINS.

## 2016-03-01 LAB — PTH, INTACT AND CALCIUM
CALCIUM TOTAL (PTH): 10.7 mg/dL — AB (ref 8.6–10.2)
PTH: 30 pg/mL (ref 15–65)

## 2016-03-01 LAB — VITAMIN D 25 HYDROXY (VIT D DEFICIENCY, FRACTURES): VIT D 25 HYDROXY: 8.8 ng/mL — AB (ref 30.0–100.0)

## 2016-03-04 LAB — CULTURE, BLOOD (ROUTINE X 2)
CULTURE: NO GROWTH
Culture: NO GROWTH

## 2016-03-10 NOTE — Discharge Summary (Signed)
Physician Discharge Summary  James Delgado ZOX:[id number] DOB: 04/22/1951 DOA: 03/18/2016  PCP: No primary care provider on file.  Admit date: 03/22/2016 Discharge date: 03/10/2016   Final Diagnoses:  1. Shock secondary presumed perforated viscous, peritonitis, sepsis, hypovolemia, hemorrhage 2. Upper GI bleed, hematemesis  3. Acute blood loss anemia 4. AKI with profound metabolic acidosis and hyperkalemia  5. Acute on chronic hypoxic respiratory failure   History of present illness:  65 year-old man with COPD, chronic hypoxic respiratory failure, [facility] possibly [facility]/MI but details not available who presented to the emergency department at [facility] with weakness, shortness of breath. Noted to be hypotensive with systolic blood pressure of 60, diaphoretic, hypothermic; there was concern for hypovolemic shock as supported by substantial acute kidney injury. He was treated with IV fluids and warming blankets with substantial improvement. Admitted for coffee-ground emesis, suspected upper GI bleed, acute kidney injury with hypotension, volume depletion, hypercalcemia, hypothermia.  Hospital Course:  Patient worsened with refractory hypotension, severe metabolic acidosis, anuria/acute kidney injury, worsening anemia. Treated with antibiotics, fluids, consultation with multiple specialists, however patient declined and suffered cardiac arrest. Return of spontaneous circulation was achieved with CPR only to arrest a second time later, with again return of spontaneous circulation after a second episode of CPR. Further workup revealed free air under the diaphragm and given the patient's history, labs and exam, it was judged most likely the patient had suffered an abdominal catastrophe, likely perforated viscus with subsequent peritonitis versus ischemic bowel and resultant shock. He was treated aggressively within the limits of requested DO NOT RESUSCITATE status, however  despite maximum medical management the patient never stabilized enough to consider advanced imaging or surgical intervention. His condition continued to decline and after further discussion plans were made for full comfort care the patient subsequently died October 2 approximately 10:30 PM.   Significant Diagnostic Studies: [place] Renal  Result Date: 02/29/2016 CLINICAL DATA:  Acute kidney injury. Nausea and vomiting for 2 days. EXAM: RENAL / URINARY TRACT ULTRASOUND COMPLETE COMPARISON:  None. FINDINGS: Right Kidney: Length: 10.4 cm. Echogenicity within normal limits. No mass or hydronephrosis visualized. Left Kidney: Length: 9.4 cm. Echogenicity within normal limits. No mass or hydronephrosis visualized. Bladder: Appears normal for degree of bladder distention. Other: Multiple stones are seen within the gallbladder. IMPRESSION: Complex appearing pelvic fluid of indeterminate location may be within the urinary bladder. Recommend correlation with urinalysis. CT scan abdomen and pelvis could also be used for further evaluation. Negative for hydronephrosis. Gallstones. Electronically Signed   By: Drusilla Kannerhomas  Dalessio M.D.   On: 03/01/2016 15:44   Dg Chest Port 1 View  Result Date: 03/09/2016 CLINICAL DATA:  Cardiac arrest. EXAM: PORTABLE CHEST 1 VIEW COMPARISON:  07-25-15.  02/21/2016. FINDINGS: Endotracheal tube tip noted approximately 4 cm above the carina. NG tube tip noted below left hemidiaphragm. Cardiomegaly with mild pulmonary vascular congestion. Mild basilar atelectasis. Free intraperitoneal air cannot be excluded. Three-way of the abdomen suggested for further evaluation. IMPRESSION: 1.  Lines and tubes in good anatomic position. 2. Free intraperitoneal air cannot be excluded. Three-way of the abdomen suggested for further evaluation. 3. Cardiomegaly with mild pulmonary venous congestion. Mild bibasilar atelectasis. Critical Value/emergent results were called by telephone at the time of interpretation  on 03/03/2016 at 11:09 am to the patient's nurse, who verbally acknowledged these results. Electronically Signed   By: Maisie Fushomas  Register   On: 03/28/2016 11:10   Dg Chest Port 1 View  Result Date: 03/04/2016 CLINICAL DATA:  Chest  pain EXAM: PORTABLE CHEST 1 VIEW COMPARISON:  02/21/2016 FINDINGS: Cardiac shadow is mildly accentuated by the portable technique. Thickening of the minor fissure is noted on the right consistent with a small amount of pleural fluid. There is likely some right basilar atelectasis present as well. These changes are new from the prior exam. No other focal abnormality is seen. IMPRESSION: New right basilar atelectasis and thickening of the minor fissure. Electronically Signed   By: Alcide Clever M.D.   On: 03/06/16 18:30   Dg Abd Portable 1v  Result Date: 03/09/2016 CLINICAL DATA:  Post cardiac arrest images.  Evaluate NG tube. EXAM: PORTABLE ABDOMEN - 1 VIEW COMPARISON:  None. FINDINGS: The NG tube tip is in the body region of the stomach. Moderate stool noted in the colon. IMPRESSION: NG tube tip is in the body region of the stomach. Electronically Signed   By: Rudie Meyer M.D.   On: 03/09/2016 11:12     Principal Problem:   Perforated viscus Active Problems:   Hypotension   Nausea & vomiting   Anemia, blood loss   CKD (chronic kidney disease) stage 3, GFR 30-59 ml/min   Metabolic acidosis   History of hypertension   GIB (gastrointestinal bleeding)   Metabolic acidemia   AKI (acute kidney injury) (HCC)   Elevated LFTs   Hematemesis   Sepsis (HCC)   Shock bowel (HCC)   Cardiac arrest (HCC)   Time : 20 minutes  Signed:  Brendia Sacks, MD [facility] 03/10/2016, 9:47 AM

## 2016-03-29 NOTE — Progress Notes (Signed)
Assisted  with transfer of pt from floor bed to icu bed with Charna Busmanindy Morris, RN and Lestine Box. Webster, NT. Pt alert and oriented.Resp even and labored, rate 28-30 on O2 at 2lpm. Increased O2 to 3lpm.  Skin dry with cool extremities. Pale in color. Fingers to rt hand discolored and when asked pt verbalized hx of smoking. Feet dirty with toenails dark in color. Noted abd distended and asked pt if size of abd normal. Pt reported abd was much larger now. Denied any pain at this time. Monitor attached and hob elevated to facilitate breathing.

## 2016-03-29 NOTE — Consult Note (Signed)
Reason for Consult: Possible acute renal failure and hypercalcemia. Referring Physician: Dr. Pleas Patricia Saefong is an 65 y.o. male.  HPI: He is a patient who has history of COPD, chronic hypoxic respiratory failure, hypertension, anemia presently was brought to the hospital because of weakness, shortness of breath. Presently unable to get more information and is also the information is from the chart. Patient seems to have been admitted to [facility] for possible CVA recently. When he was evaluated and was found to have elevated BUN and creatinine hence at this moment no shortness was that this acute, acute on chronic. Past Medical History:  Diagnosis Date  . Blood transfusion without reported diagnosis   . Heart attack   . Hypertension     History reviewed. No pertinent surgical history.  History reviewed. No pertinent family history.  Social History:  reports that he has been smoking Cigarettes.  He has been smoking about 1.00 pack per day. He has never used smokeless tobacco. He reports that he does not drink alcohol or use drugs.  Allergies:  Allergies  Allergen Reactions  . Seroquel [Quetiapine Fumarate] Other (See Comments)    Patient has hallucinations    Medications: I have reviewed the patient's current medications.  Results for orders placed or performed during the hospital encounter of 03/04/2016 (from the past 48 hour(s))  I-stat troponin, ED     Status: None   Collection Time: 03/13/2016  6:11 PM  Result Value Ref Range   Troponin i, poc 0.00 0.00 - 0.08 ng/mL   Comment 3            Comment: Due to the release kinetics of cTnI, a negative result within the first hours of the onset of symptoms does not rule out myocardial infarction with certainty. If myocardial infarction is still suspected, repeat the test at appropriate intervals.   I-STAT 4, (NA,K, GLUC, HGB,HCT)     Status: Abnormal   Collection Time: 03/09/2016  6:25 PM  Result Value Ref Range   Sodium 125 (L) 135 - 145 mmol/L   Potassium 5.9 (H) 3.5 - 5.1 mmol/L   Glucose, Bld 222 (H) 65 - 99 mg/dL   HCT 42.0 39.0 - 52.0 %   Hemoglobin 14.3 13.0 - 17.0 g/dL  CBC     Status: Abnormal   Collection Time: 03/04/2016  6:37 PM  Result Value Ref Range   WBC 18.2 (H) 4.0 - 10.5 K/uL   RBC 3.88 (L) 4.22 - 5.81 MIL/uL   Hemoglobin 11.6 (L) 13.0 - 17.0 g/dL   HCT 35.2 (L) 39.0 - 52.0 %   MCV 90.7 78.0 - 100.0 fL   MCH 29.9 26.0 - 34.0 pg   MCHC 33.0 30.0 - 36.0 g/dL   RDW 13.1 11.5 - 15.5 %   Platelets 334 150 - 400 K/uL    Comment: SPECIMEN CHECKED FOR CLOTS PLATELET COUNT CONFIRMED BY SMEAR GIANT PLATELETS SEEN LARGE PLATELETS PRESENT   Comprehensive metabolic panel     Status: Abnormal   Collection Time: 03/11/2016  6:37 PM  Result Value Ref Range   Sodium 128 (L) 135 - 145 mmol/L   Potassium 4.2 3.5 - 5.1 mmol/L    Comment: DELTA CHECK NOTED   Chloride 91 (L) 101 - 111 mmol/L   CO2 10 (L) 22 - 32 mmol/L   Glucose, Bld 234 (H) 65 - 99 mg/dL   BUN 43 (H) 6 - 20 mg/dL   Creatinine, Ser 4.07 (H) 0.61 - 1.24 mg/dL  Calcium 11.8 (H) 8.9 - 10.3 mg/dL   Total Protein 6.9 6.5 - 8.1 g/dL   Albumin 2.9 (L) 3.5 - 5.0 g/dL   AST 27 15 - 41 U/L   ALT 13 (L) 17 - 63 U/L   Alkaline Phosphatase 131 (H) 38 - 126 U/L   Total Bilirubin 0.4 0.3 - 1.2 mg/dL   GFR calc non Af Amer 14 (L) >60 mL/min   GFR calc Af Amer 17 (L) >60 mL/min    Comment: (NOTE) The eGFR has been calculated using the CKD EPI equation. This calculation has not been validated in all clinical situations. eGFR's persistently <60 mL/min signify possible Chronic Kidney Disease.    Anion gap 27 (H) 5 - 15  Troponin I     Status: None   Collection Time: 03/19/2016  6:37 PM  Result Value Ref Range   Troponin I <0.03 <0.03 ng/mL  APTT     Status: Abnormal   Collection Time: 03/19/2016  6:37 PM  Result Value Ref Range   aPTT 37 (H) 24 - 36 seconds    Comment:        IF BASELINE aPTT IS ELEVATED, SUGGEST PATIENT RISK  ASSESSMENT BE USED TO DETERMINE APPROPRIATE ANTICOAGULANT THERAPY.   I-stat Chem 8, ED     Status: Abnormal   Collection Time: 03/28/2016  7:13 PM  Result Value Ref Range   Sodium 127 (L) 135 - 145 mmol/L   Potassium 4.2 3.5 - 5.1 mmol/L   Chloride 96 (L) 101 - 111 mmol/L   BUN 38 (H) 6 - 20 mg/dL   Creatinine, Ser 4.10 (H) 0.61 - 1.24 mg/dL   Glucose, Bld 219 (H) 65 - 99 mg/dL   Calcium, Ion 1.49 (H) 1.15 - 1.40 mmol/L   TCO2 12 0 - 100 mmol/L   Hemoglobin 12.2 (L) 13.0 - 17.0 g/dL   HCT 36.0 (L) 39.0 - 52.0 %  MRSA PCR Screening     Status: None   Collection Time: 03/22/2016 10:49 PM  Result Value Ref Range   MRSA by PCR NEGATIVE NEGATIVE    Comment:        The GeneXpert MRSA Assay (FDA approved for NASAL specimens only), is one component of a comprehensive MRSA colonization surveillance program. It is not intended to diagnose MRSA infection nor to guide or monitor treatment for MRSA infections.   Comprehensive metabolic panel     Status: Abnormal   Collection Time: 03/17/16  4:10 AM  Result Value Ref Range   Sodium 128 (L) 135 - 145 mmol/L   Potassium 4.6 3.5 - 5.1 mmol/L   Chloride 94 (L) 101 - 111 mmol/L   CO2 16 (L) 22 - 32 mmol/L   Glucose, Bld 97 65 - 99 mg/dL   BUN 50 (H) 6 - 20 mg/dL   Creatinine, Ser 4.39 (H) 0.61 - 1.24 mg/dL   Calcium 11.2 (H) 8.9 - 10.3 mg/dL   Total Protein 6.2 (L) 6.5 - 8.1 g/dL   Albumin 2.7 (L) 3.5 - 5.0 g/dL   AST 149 (H) 15 - 41 U/L   ALT 127 (H) 17 - 63 U/L   Alkaline Phosphatase 103 38 - 126 U/L   Total Bilirubin 0.7 0.3 - 1.2 mg/dL   GFR calc non Af Amer 13 (L) >60 mL/min   GFR calc Af Amer 15 (L) >60 mL/min    Comment: (NOTE) The eGFR has been calculated using the CKD EPI equation. This calculation has not been validated in  all clinical situations. eGFR's persistently <60 mL/min signify possible Chronic Kidney Disease.    Anion gap 18 (H) 5 - 15  CBC     Status: Abnormal   Collection Time: 03/24/16  4:10 AM  Result Value  Ref Range   WBC 3.8 (L) 4.0 - 10.5 K/uL   RBC 3.92 (L) 4.22 - 5.81 MIL/uL   Hemoglobin 11.6 (L) 13.0 - 17.0 g/dL   HCT 34.3 (L) 39.0 - 52.0 %   MCV 87.5 78.0 - 100.0 fL   MCH 29.6 26.0 - 34.0 pg   MCHC 33.8 30.0 - 36.0 g/dL   RDW 13.1 11.5 - 15.5 %   Platelets 217 150 - 400 K/uL  Protime-INR     Status: Abnormal   Collection Time: 2016-03-24  4:10 AM  Result Value Ref Range   Prothrombin Time 17.8 (H) 11.4 - 15.2 seconds   INR 1.45   Blood gas, arterial     Status: Abnormal   Collection Time: Mar 24, 2016  7:00 AM  Result Value Ref Range   FIO2 100.00    Delivery systems BILEVEL POSITIVE AIRWAY PRESSURE    Mode OXYGEN MASK    LHR 8 resp/min   Inspiratory PAP 14    Expiratory PAP 6.0    pH, Arterial 7.157 (LL) 7.350 - 7.450    Comment: CRITICAL RESULT CALLED TO, READ BACK BY AND VERIFIED WITH:  TO KNICK K RT AT 0712 BY [facility] RRT Mar 24, 2016    pCO2 arterial 29.0 (L) 32.0 - 48.0 mmHg   pO2, Arterial 453.0 (H) 83.0 - 108.0 mmHg   Bicarbonate 11.5 (L) 20.0 - 28.0 mmol/L   Acid-base deficit 17.2 (H) 0.0 - 2.0 mmol/L   O2 Saturation 99.4 %   Collection site BRACHIAL ARTERY    Drawn by [id number]    Sample type ARTERIAL    Allens test (pass/fail) NOT INDICATED (A) PASS    Dg Chest Port 1 View  Result Date: 03/28/2016 CLINICAL DATA:  Chest pain EXAM: PORTABLE CHEST 1 VIEW COMPARISON:  02/21/2016 FINDINGS: Cardiac shadow is mildly accentuated by the portable technique. Thickening of the minor fissure is noted on the right consistent with a small amount of pleural fluid. There is likely some right basilar atelectasis present as well. These changes are new from the prior exam. No other focal abnormality is seen. IMPRESSION: New right basilar atelectasis and thickening of the minor fissure. Electronically Signed   By: Inez Catalina M.D.   On: 03/11/2016 18:30    Review of Systems  Unable to perform ROS: Mental status change   Blood pressure 133/77, pulse 95, temperature 98.2 F (36.8 C),  temperature source Rectal, resp. rate 25, height _0  (1.854 m), weight 92.4 kg (203 lb 11.3 oz), SpO2 100 %. Physical Exam  Constitutional: No distress.  Eyes: No scleral icterus.  Neck: No JVD present.  Cardiovascular: Normal rate and regular rhythm.   No murmur heard. Respiratory: No respiratory distress. He has no wheezes.  GI: He exhibits distension. There is no tenderness.  Musculoskeletal: He exhibits no edema.  Neurological: He is alert.  Patient is alert, confused. He is disoriented to time and place. Presently patient is an audible when he's trying to talk. He was not able to tell me where he is and why he came to the hospital.    Assessment/Plan:  Problem #1 renal failure:  acute on chronic. The present increasing BUN and creatinine could be secondary to ATN, prerenal. Patient presently is nonoliguric. Problem #2  hypotension: His blood pressure at this moment seems to be somewhat better. Patient came with history of nausea vomiting possibly dehydration but other etiologies at this moment  cannot be ruled out. Patient at this moment doesn't look septic. Problem #3 hypercalcemia: Etiology not clear. Primary versus secondary. Dehydration and calcium intake possibly also Contributes to the Etiology. Problem #4 History of COPD Problem #5 metabolic acidosis: Most likely secondary to his renal failure. Presently his CO2 is 16 and improving. Patient is also diabetic. Problem #6 history of chronic renal failure: With creatinine 1.5. Possibly stage III. Problem #7 history of GI bleeding.   plan:  We will decrease his ivf to 135 cc/hr 2]Bumex 1 mg iv once a day 3]Renal panel in am 4]Intact PTH and vitamin D level 5 US of the kidneys   Emmilyn Crooke S 03/10/16, 8:47 AM

## 2016-03-29 NOTE — Progress Notes (Signed)
Pharmacy Antibiotic Note  James Delgado is a 65 y.o. male admitted on 03/25/2016 with sepsis.  Pharmacy has been consulted for vancomycin and Zosyn dosing.  Plan: 1. Vancomycin 1000 mg IV Q48h 2. Zosyn 2.25gm IV q6h 3. Will monitor cultures and renal function.  4. Will check vancomycin trough as appropriate.   Height: 6\' 1"  (185.4 cm) Weight: 203 lb 11.3 oz (92.4 kg) IBW/kg (Calculated) : 79.9  Temp (24hrs), Avg:97 F (36.1 C), Min:96.1 F (35.6 C), Max:98.2 F (36.8 C)   Recent Labs Lab 03/19/2016 1837 02/27/2016 1913 03-15-16 0410  WBC 18.2*  --  3.8*  CREATININE 4.07* 4.10* 4.39*    Estimated Creatinine Clearance: 19.5 mL/min (by C-G formula based on SCr of 4.39 mg/dL (H)).    Allergies  Allergen Reactions  . Seroquel [Quetiapine Fumarate] Other (See Comments)    Patient has hallucinations    Antimicrobials this admission: 10/1 vancomycin >>  10/1 Zosyn >>   Dose adjustments this admission: 10/1 Vancomycin changed to 1gm IV q48h 10/1 Zosyn changed to 2.25gm IV q6h  None  Microbiology results: 10/1 UCx:   10/1 MRSA PCR:   Thank you for allowing pharmacy to be a part of this patient's care. Elder CyphersLorie Nakaila Freeze, BS Loura Backharm D, [place] Clinical Pharmacist Pager [phone] 03/20/2016 12:25 PM

## 2016-03-29 NOTE — Progress Notes (Signed)
PT PRONOUNCED EXPIRED. NO HR. NO SPONTANEOUS RESPIRATIONS. NO B/P. PUPILS FIXED. DR OPYD NOTIFIED.

## 2016-03-29 NOTE — Progress Notes (Signed)
Present for support. Called his wife, Misty StanleyLisa and asked her to come to the hospital. She said she didn't drive but would be getting a friend to bring as soon as possible.

## 2016-03-29 NOTE — Progress Notes (Signed)
Went in to check on pt this morning and pt still had not voided all night. Noticed pt is having a harder work at breathing. Pt was c/o stomach pain. Did bladder scan and it was very inconsistent. Largest reading was 454cc. Paged Dr and was told to place a foley catheter. PLaced catheter and there was hardly no urine return. Pt was still c/o pain to abd and feeling sob. Pt was pale and working hard to breath. Called Resp and  Paged Dr back and he came to see the patient. Placed patient on bipap. Resp has not been able to get ABG on pt. Pts BP has been low since arrival and thready pulse. Hard to get manual BP or BP on monitor top read correctly. Dr is still at bedside.

## 2016-03-29 NOTE — Consult Note (Signed)
Referring Provider: [facility] Primary Care Physician:  No primary care provider on file. Primary Gastroenterologist:  Dr. Oneida Alar  Date of Admission: 12-Mar-2016 Date of Consultation: 03-12-16  Reason for Consultation:  Anemia, coffee-ground emesis, melena  HPI:  James Delgado is a 65 y.o. male with a past medical history of MI, htn, recent blood transfusion. The patient was brought to the ED by EMS who found the patient hypoxic and sitting on the floor. Sats improved from 86% on 2lpm O2 per [place]. Was apparently at [facility] 9/5 - 9/9 and (per his wife) "told he had a light heart attack and light stroke" but also had hgb 7.1 which improved to 8.9 with 2 units PRBC. Also azotemia at that time with Cr 2.6 improved to 1.49. Admitted for chest pain again to [facility] 9/25 - 9/26 with neg troponins, normal EKG and Cr at that time 1.5. He was d/c'd home for outpatient stress test.   On presentation to the ER patient states 2 weeks vomiting "black, foul smelling" material, denies bright red blood emesis or brbpr. Noted chest pain and abdominal pain "all over." Denied NSAIDs. Has been taking TUMS and "antacid pills" as many as 6 per day. Denied history of PUD.   In the ED he also noted increasing weakness. Noted distress, hypotenive, tachycardic, tachypnic with decreased breath sounds. BP noted 60/40, query hypovolemic shock as well as hypothermia. Recussitated with IVF and improvement in BP to 003 systolic. Labs: leucocytosis (WBC 18.2), Hgb 11.6, Cr 4.1.Troponin negative. Hyponatremia (Na 128). It appears his hgb on iStat was initially 14 but unsure of the accuracy of this result. AST/ALT increased to 149/127, bili and alk phos remain normal. Transaminases previously normal.  Patient unable to coherently answer most questions, per nurse this has been his baseline for him since admission. When asked if he's hurting did indicated throat, chest, abdominal pain down to pelvic area. Family not present with the  patient. No other answers able to be definitively elicited from the patient.  Past Medical History:  Diagnosis Date  . Blood transfusion without reported diagnosis   . Heart attack   . Hypertension     History reviewed. No pertinent surgical history.  Prior to Admission medications   Medication Sig Start Date End Date Taking? Authorizing Provider  ALPRAZolam Duanne Moron) 0.5 MG tablet Take 0.5 mg by mouth 2 (two) times daily as needed. 01/29/16  Yes Historical Provider, MD  amLODipine-benazepril (LOTREL) 5-10 MG capsule Take 1 capsule by mouth daily. 12/29/15  Yes Historical Provider, MD  busPIRone (BUSPAR) 15 MG tablet Take 30 mg by mouth 2 (two) times daily.  02/14/16  Yes Historical Provider, MD  ferrous sulfate 325 (65 FE) MG tablet Take 325 mg by mouth 2 (two) times daily. 02/08/16  Yes Historical Provider, MD  gemfibrozil (LOPID) 600 MG tablet Take 600 mg by mouth 2 (two) times daily. 01/17/16  Yes Historical Provider, MD  HYDROcodone-acetaminophen (NORCO/VICODIN) 5-325 MG tablet Take 1 tablet by mouth 3 (three) times daily as needed for moderate pain or severe pain.  02/16/16  Yes Historical Provider, MD  OLANZapine (ZYPREXA) 10 MG tablet Take 10 mg by mouth 2 (two) times daily.  01/17/16  Yes Historical Provider, MD  propranolol (INDERAL) 10 MG tablet Take 10 mg by mouth 4 (four) times daily.   Yes Historical Provider, MD  traMADol (ULTRAM) 50 MG tablet Take 1 tablet by mouth 3 (three) times daily as needed. 02/04/16  Yes Historical Provider, MD    Current Facility-Administered  Medications  Medication Dose Route Frequency Provider Last Rate Last Dose  . acetaminophen (TYLENOL) tablet 650 mg  650 mg Oral Q6H PRN Roney Jaffe, MD       Or  . acetaminophen (TYLENOL) suppository 650 mg  650 mg Rectal Q6H PRN Roney Jaffe, MD      . ALPRAZolam Duanne Moron) tablet 0.5 mg  0.5 mg Oral BID PRN Roney Jaffe, MD   0.5 mg at 2016/03/21 0543  . bumetanide (BUMEX) injection 1 mg  1 mg Intravenous Daily  Fran Lowes, MD      . busPIRone (BUSPAR) tablet 30 mg  30 mg Oral BID Roney Jaffe, MD   30 mg at 03/15/2016 2332  . dextrose 5 % 1,000 mL with sodium bicarbonate 150 mEq infusion   Intravenous Continuous Fran Lowes, MD 150 mL/hr at 03/10/2016 2332    . famotidine (PEPCID) IVPB 20 mg premix  20 mg Intravenous Q12H Roney Jaffe, MD   20 mg at 03/05/2016 2332  . ipratropium-albuterol (DUONEB) 0.5-2.5 (3) MG/3ML nebulizer solution 3 mL  3 mL Nebulization Q4H PRN Vianne Bulls, MD      . morphine 2 MG/ML injection 1-2 mg  1-2 mg Intravenous Q4H PRN Roney Jaffe, MD   1 mg at 03/24/2016 2333  . OLANZapine (ZYPREXA) tablet 10 mg  10 mg Oral BID Roney Jaffe, MD   10 mg at 03/18/2016 2332  . piperacillin-tazobactam (ZOSYN) IVPB 3.375 g  3.375 g Intravenous Q8H Roney Jaffe, MD   3.375 g at Mar 21, 2016 0543  . vancomycin (VANCOCIN) IVPB 1000 mg/200 mL premix  1,000 mg Intravenous Q24H Roney Jaffe, MD   1,000 mg at 03/02/2016 2332    Allergies as of 03/08/2016 - Review Complete 03/07/2016  Allergen Reaction Noted  . Seroquel [quetiapine fumarate] Other (See Comments) 02/29/2016    History reviewed. No pertinent family history.  Social History   Social History  . Marital status: Married    Spouse name: N/A  . Number of children: N/A  . Years of education: N/A   Occupational History  . Not on file.   Social History Main Topics  . Smoking status: Current Every Day Smoker    Packs/day: 1.00    Types: Cigarettes  . Smokeless tobacco: Never Used  . Alcohol use No  . Drug use: No  . Sexual activity: Not Currently   Other Topics Concern  . Not on file   Social History Narrative  . No narrative on file    Review of Systems: ROS limited by patient inability to coherently respond to questions.  GI: See history of present illness.  Physical Exam: Vital signs in last 24 hours: Temp:  [96.1 F (35.6 C)-98.2 F (36.8 C)] 98.2 F (36.8 C) (10/02 0400) Pulse Rate:  [69-139]  69 (10/02 0900) Resp:  [22-33] 27 (10/02 0900) BP: (69-133)/(40-95) 106/81 (10/02 0900) SpO2:  [57 %-100 %] 57 % (10/02 0900) FiO2 (%):  [0 %-100 %] 100 % (10/02 0711) Weight:  [195 lb (88.5 kg)-203 lb 11.3 oz (92.4 kg)] 203 lb 11.3 oz (92.4 kg) (10/02 0500) Last BM Date: 02/26/16 General:   Alert,  Obese male in no acute respiratory distress, awake and attempts to answer questions but answers unintelligible. Head:  Normocephalic and atraumatic. Eyes:  Sclera clear, no icterus. Conjunctiva pink. Ears:  Normal auditory acuity. Neck:  Supple; no masses or thyromegaly. Lungs:  Clear throughout to auscultation. No wheezes, crackles, or rhonchi. No acute distress. Heart:  Regular rate and rhythm; no  murmurs, clicks, rubs,  or gallops. Abdomen:  Obese but soft, and nondistended. Hypoactive bowel sounds, without guarding, and without rebound.   Rectal:  Deferred.   Msk:  Symmetrical without gross deformities. Pulses:  Normal bilateral DP pulses noted. Extremities:  Without clubbing or edema. Neurologic:  Alert and  oriented x4; grossly normal neurologically. Psych:  Alert but not oriented. Incoherent answers to questions.  Intake/Output from previous day: 10/01 0701 - 10/02 0700 In: 1000 [IV Piggyback:1000] Out: -  Intake/Output this shift: No intake/output data recorded.  Lab Results:  Recent Labs  03/04/2016 1837 03/15/2016 1913 07-Mar-2016 0410  WBC 18.2*  --  3.8*  HGB 11.6* 12.2* 11.6*  HCT 35.2* 36.0* 34.3*  PLT 334  --  217   BMET  Recent Labs  03/23/2016 1837 02/29/2016 1913 03/07/2016 0410  NA 128* 127* 128*  K 4.2 4.2 4.6  CL 91* 96* 94*  CO2 10*  --  16*  GLUCOSE 234* 219* 97  BUN 43* 38* 50*  CREATININE 4.07* 4.10* 4.39*  CALCIUM 11.8*  --  11.2*   LFT  Recent Labs  03/20/2016 1837 2016/03/07 0410  PROT 6.9 6.2*  ALBUMIN 2.9* 2.7*  AST 27 149*  ALT 13* 127*  ALKPHOS 131* 103  BILITOT 0.4 0.7   PT/INR  Recent Labs  March 07, 2016 0410  LABPROT 17.8*  INR 1.45    Hepatitis Panel No results for input(s): HEPBSAG, HCVAB, HEPAIGM, HEPBIGM in the last 72 hours. C-Diff No results for input(s): CDIFFTOX in the last 72 hours.  Studies/Results: Dg Chest Port 1 View  Result Date: 03/09/2016 CLINICAL DATA:  Chest pain EXAM: PORTABLE CHEST 1 VIEW COMPARISON:  02/21/2016 FINDINGS: Cardiac shadow is mildly accentuated by the portable technique. Thickening of the minor fissure is noted on the right consistent with a small amount of pleural fluid. There is likely some right basilar atelectasis present as well. These changes are new from the prior exam. No other focal abnormality is seen. IMPRESSION: New right basilar atelectasis and thickening of the minor fissure. Electronically Signed   By: Inez Catalina M.D.   On: 03/05/2016 18:30    Impression: 65 year old male presented with several problems. Per chart review it appears that the patient was seen at [facility] with chest pain, was noted to have hgb 7.1 with 2 units PRBCs given. No mention of GI workup for his anemia. He presented via EMS in distress, respiratory failure/hypoxia, confusion, hypotension, acute renal failure. Patient and/or family described recent coffee-ground emesis and melena. None noted since admission to ICU per nursing staff. Patient admitted abdominal pain in the ER and seems to indicate this at this time as well. His abdomen is obese/rounded, but not overtly distended/rigid; no rebound.   His hgb this morning was 11.6; iStat in the ER indicates hgb 14, unsure of accuracy of iStat hgb versus rehydration effect or true ongoing GI bleed. Hypotensive 60/40 with fluid recussitation in the ER to SBP 100. Currently BP is soft with 90s/60s noted. Somewhat tachycardic as well in the low 100s.   Possible etiologies include esophagitis, gastritis, PUD, AVMs. Spoke with hospitalist who plans on stat CT imaging to further evaluate. Given no active bleeding can likely hold off on emergent bedside EGD at this  time. However, will likely need EGD during admission given presentation and pending follow-up CBC results.  Elevation in transaminases possible from shock liver with noted hypotension.  Plan: 1. Follow closely for any recurrent GIB. RN given my pager and  asked to page me for any noted bleeding 2. Follow H/H closely 3. Change IV Pepcid to Pantoprazole for likely GI bleed.  4. Follow LFTs. 5. Supportive measures 6. Will follow closely with you 7. Will likely need EGD during admission pending labs, any recurrent bleeding, and clinical progress.   Thank you for allowing [place] to participate in the care of James Bonds, DNP, AGNP-C Adult & Gerontological Nurse Practitioner [facility]    LOS: 1 day     03/01/2016, 9:20 AM

## 2016-03-29 NOTE — Evaluation (Signed)
CPR note.  At 9:50 AM the patient was noted to become bradycardic, then asystolic. CPR was immediately initiated. Treated with epinephrine, bicarbonate. Intubated without complication by Dr. Lovell SheehanJenkins. Return of spontaneous circulation 1000. Blood sugars noted to be normal during this time.  I was present for and directed the entire resuscitative effort.  A short time later the patient began became asystolic, had no pulse and CPR was again immediately initiated. Patient was again treated with epinephrine and subsequently had ROSC. I was present on the unit at the start and was present for and directed the entire resuscitative effort.  Brendia Sacksaniel Iyanni Hepp, MD [facility] [phone]

## 2016-03-29 NOTE — Progress Notes (Signed)
PROGRESS NOTE  James Delgado GMW:[id number] DOB: 03/13/1951 DOA: 03/24/2016 PCP: No primary care provider on file.  Brief Narrative:  65 year-old man with COPD, chronic hypoxic respiratory failure, [facility], possibly for stroke/MI but details are not available who presented to the emergency department with weakness, shortness of breath. Noted to be hypotensive with systolic blood pressure of 60, diaphoretic, hypothermic; there was concern for hypovolemic shock as supported by substantial acute kidney injury. He was treated with IV fluids and warming blankets with substantial improvement. Admitted for coffee-ground emesis, suspected upper GI bleed, acute kidney injury with hypotension, volume depletion, hypercalcemia, hypothermia.  Assessment/Plan: 1. Upper GI bleed with nausea, vomiting, coffee-ground emesis. Per record review, history of anemia requiring transfusion at outside facility. 2. Normocytic anemia, with history of anemia requiring transfusion within the last month. Suspect secondary to upper GI bleed. Hemoglobin currently 11.6.  3. Hypovolemic shock, likely combination of volume depletion and hemorrhagic. Blood pressure improved. 4. Acute kidney injury with anion gap metabolic acidosis, secondary to acute kidney injury, hyperglycemia is also noted on admission. secondary to numbers 1-3. Worsening. Poor urine output. Check renal ultrasound. Check urinalysis. 5. Acute on chronic hypoxic respiratory failure (COPD), required BiPAP during the night but now on nasal cannula. 6. Hypoglycemia. Blood sugar is 23. 7. Hypothermia, resolved. Etiology unclear. Cultures pending. Leukocytosis has resolved. No fever. Currently on empiric antibiotics. Low threshold to discontinue in the next 24-48 hours if no source is found. 8. Hypercalcemia, thought secondary to calcium carbonate (frequently taking Tums for stomach pain). Malignancy felt to be less likely. No change thus far but  urine output has been poor. 9. Elevated transaminases of unclear significance. Trending up. 10. Hyponatremia, likely hypovolemic 11. Anxiety, depression. On Xanax twice a day when necessary at home as well as Zyprexa. 12. Tobacco use disorder.   Critically ill  Urgent GI consultation. Start PPI. Trend hemoglobin. Stat abdominal x-ray. CT abdomen and pelvis oral contrast only.   Will need a PICC line placement.   Aggressive volume resuscitation, strict I/O, continue sodium bicarbonate infusion, check renal ultrasound, consult nephrology  Continue nasal cannula  Trend BMP, calcium. Suspect anion gap elevated secondary to uremia rather than DKA. No history of diabetes. Hyperglycemia on admission resolved at this point. At this point we'll continue present management and check hemoglobin A1c.  Check hepatic function panel in the morning   Discussed in detail with nephrology and GI team.  Summary of records reviewed:  No previous records in the system. No records available in care everywhere.  According to the ED nurse documentation [facility] and was told he had a "light heart attack" and "light stroke"]. According to emergency department physician documentation "Patient eventually states that he received a blood transfusion at another facility 2 weeks ago". No further documentation is available  [facility] hospitalization 9/5-9/9: Anemia, prerenal azotemia, hyponatremia, hyperkalemia treated with IV fluids and blood transfusion with improvement of creatinine 1.49.  [facility] hospitalization 9/25-9/26: Chest pain rule out. Ruled out for ACS. Outpatient stress test planned.  DVT prophylaxis: SCDs  Code Status: Full  Family Communication: No family bedside Disposition Plan: Discharge home once improved.   Brendia Sacksaniel Lanijah Warzecha, MD  [facility] Direct contact: [phone] --Via amion app OR  --[email or web address]; password [email or web address]  7PM-7AM contact night coverage as  above 03/04/2016, 5:57 AM  LOS: 1 day   Consultants:  None   Procedures:  BiPAP 10/2   Antimicrobials:  Vancomycin 10/1  CC: Follow-up upper GI  bleed   Interval history/Subjective: Review of chart, inconsistent bladder scan, Foley catheter placed with minimal urine return. Increased work of breathing, placed on BiPAP.  Patient reports left-sided abdominal pain, chest pain, nausea, and vomiting. States his breathing is not well.    ROS: unattainable   Objective: Vitals:   2016/03/12 2255 2016/03/12 2315 03/20/2016 0400 03/08/2016 0500  BP:      Pulse:      Resp:      Temp:  97.5 F (36.4 C) 98.2 F (36.8 C)   TempSrc:  Rectal Rectal   SpO2: 100%     Weight:  91.4 kg (201 lb 8 oz)  92.4 kg (203 lb 11.3 oz)  Height:  6\' 1"  (1.854 m)      Intake/Output Summary (Last 24 hours) at 03/06/2016 0557 Last data filed at 12-Mar-2016 1839  Gross per 24 hour  Intake             1000 ml  Output                0 ml  Net             1000 ml     Filed Weights   03/12/16 1805 Mar 12, 2016 2315 03/12/2016 0500  Weight: 88.5 kg (195 lb) 91.4 kg (201 lb 8 oz) 92.4 kg (203 lb 11.3 oz)     Exam: Constitutional:  . Appears ill but not toxic  Eyes:   Scleral and irises appear normal  3 mm right cataract, Left dense cataract 2 mm reactive  ENMT:  . external ears, nose appear normal . grossly normal hearing  . Lips appear normal;  Neck:  . neck appears normal, no masses, normal ROM, supple . no thyromegaly Respiratory:  . CTA bilaterally, no w/r/r.  . Respiratory effort normal. No retractions or accessory muscle use Cardiovascular:  . RRR, no m/r/g . No LE extremity edema   Abdomen:  . Abdomen appears soft, non tender, and distended.  . No hernias  GU uncircumcised penis. Scrotum and testes are normal.  Musculoskeletal:   Moves all extremities.   Normal tone and strength  Skin:  . No rashes, lesions, ulcers noted . palpation of skin: no induration or nodules Psychiatric:    CannotAssess judgement insight and motor affect   I have personally reviewed following labs and imaging studies:  PH: 7.157 / 29.0 / 453  Sodium 128, no change   CBG 23, trending down   CO2 16, improved  Creatinine 4.39, BUN 50. Both are trending up.   Calcium 11.2, no significant change   LFTs are elevated: AST 149, ALT 127; both trending up. Alkaline phosphatase and total bilirubin normal  WBC down to 3.8. Hemoglobin stable 11.6. INR within normal limits.  Chest x-ray independently reviewed, no infiltrates noted. Radiologist noted right base atelectasis  EKG on admission independently reviewed sinus tach, T-wave inversion inferiorly. Poor quality EKG. No previous available for comparison.  Scheduled Meds: . busPIRone  30 mg Oral BID  . famotidine (PEPCID) IV  20 mg Intravenous Q12H  . OLANZapine  10 mg Oral BID  . piperacillin-tazobactam (ZOSYN)  IV  3.375 g Intravenous Q8H  . vancomycin  1,000 mg Intravenous Q24H   Continuous Infusions: . dextrose 5 % 1,000 mL with sodium bicarbonate 150 mEq infusion 150 mL/hr at 12-Mar-2016 2332    Principal Problem:   Nausea & vomiting Active Problems:   Hypotension   Coffee ground emesis   GI bleed   Anemia,  blood loss   Acute on chronic renal failure (HCC)   CKD (chronic kidney disease) stage 3, GFR 30-59 ml/min   Hypercalcemia   Acute milk alkali syndrome   Metabolic acidosis   History of hypertension   Chronic psychosis   GIB (gastrointestinal bleeding)   LOS: 1 day     By signing my name below, I, Cynda Acres, attest that this documentation has been prepared under the direction and in the presence of Brendia Sacks, MD. Electronically signed: Cynda Acres, Scribe. 2016/03/09 9:05 AM   I personally performed the services described in this documentation. All medical record entries made by the scribe were at my direction. I have reviewed the chart and agree that the record reflects my personal performance and is  accurate and complete. Brendia Sacks, MD

## 2016-03-29 NOTE — Evaluation (Addendum)
After seeing the patient he was noted to have profound hypoglycemia. Treated with D50.  While in the unit, patient developed sudden bradycardia, agonal breathing, lost pulse. Code called 950, see code sheet for full documentation.   CPR and BMV respirations initiated immediately. ROSC was achieved.  Central line inserted.  Hypotensive  Skin mottled Lungs clear Heart distant  NG tube with dark coffee-ground like material  EKG independently reviewed normal rate, narrow complex rhythm, junctional versus atrial fibrillation. No ST elevation. Code labs showed worsening renal function with creatinine 5.02 BUN 54, potassium 5.5. Troponin 0.30 on surprising given to resuscitative efforts. Lactic acid 20.5. Hemoglobin down to 8.7. chest x-ray: my read, free air under right hemidiaphragm. Agree with radiologist's read. abdominal x-ray: Prominent stool  Patient again lost pulse, CPR again initiated treated with compressions, epinephrine with return of spontaneous circulation.  Impression, most likely perforated viscus with history of hematemesis, free air under the diaphragm; hypotension/shock, acute kidney injury, sepsis with hypotension s/p volume resuscitation, profound metabolic acidosis. Discussed with Dr. Franky MachoMark Jenkins general surgery, reviewed imaging, clinical scenario. If the patient can be stabilized he recommends CT abdomen and pelvis; per Dr. Lovell SheehanJenkins the patient is not a surgical candidate and medical management is recommended. Should his condition stabilize with medical interventions, proceed with imaging, and reevaluate.   At this point the patient remains too unstable to transport for CT and is not a surgical candidate. He requires vasopressor support, has mottling of the skin, and systolic blood pressure 60-70. His prognosis is grim in his clinical condition is critical. He has signs of poor perfusion and survival is felt to be unlikely.  Discussed in detail with his wife/power of  attorney Daneil DanLisa Horne by telephone initially. We discussed the above. She reported that he would not want further CPR and requested DO NOT RESUSCITATE status. She understands she is critically ill and is not expected to survive.  Lyman BishopLawrence, RN was witness by telephone.   Addendum  Wife arrived approximately 2 PM. Long discussion, Lawrence, RN also present. We discussed the patient's ongoing issues which had not shown a significant improvement, evidence of multiorgan failure and high suspicion of either ischemic bowel or ruptured viscus as well as instability and inability to undergo further scanning or surgery at this point. She is understanding of this. We discussed continuing current management versus de-escalation of care. She is going to talk to family members but for now plan is to continue full support.   Critical care time 2 hours during which I was with the patient or immediately available on the unit, coordinating care with multiple physicians in discussion with family. This does not include CPR time.  Brendia Sacksaniel Goodrich, MD [facility] [phone]

## 2016-03-29 NOTE — Significant Event (Signed)
Was called to the bedside to evaluate Mr. James Delgado as his condition has continued to deteriorate despite aggressive attempts to stabilize him. His BP has declined despite aggressive pressor support, he has become increasingly mottled. Troponin rising, renal fxn worsening, potassium rising, Hgb dropping. Reached out to EMCORLisa Delgado (pt's wife) to update her on his current condition. She called me back after discussing goals of care with other family members and asked that we do everything we can to make sure he isn't suffering. James Delgado's main concern at this point is that the patient not be in pain or anxious. She asks that we stop life-support and focus on allowing him a comfortable passing. We confirmed that the vasopressors and other therapies will be discontinued and morphine infusion will be started. She understands that the Mr. James Delgado will likely perish over the next minutes-to-hours.

## 2016-03-29 NOTE — Progress Notes (Signed)
[id number] RN Julien GirtMcDaniel noticed that heart rate had started to decline. She came to room and called me. Code Blue called and compressions started. Due to labs showing low sodium bicarb one amp given. D50 had been given about 1 hour before code situation and had been rechecked now 173. Patient intubated by Dr. Lovell SheehanJenkins.at [phone]. Compression continued throughout code. 0957 epi 1-10000. 1mg  given. 1 amp of D50 given at 0959 and 1 amp bicarb given. 1000 pulse returned with a rate of 112 and bloodpressure of 63/46. Femoral doppler confirmed pulse. 1003 bloodpressure 88/73. Central line placed by Dr. Kizzie BaneSamtami. And fluids now run throught central line.

## 2016-03-29 DEATH — deceased

## 2017-05-22 IMAGING — CR DG ABD PORTABLE 1V
1 series · 1 of 1 positions shown · non-contrast
Comparison: None.

CLINICAL DATA: Post cardiac arrest images.  Evaluate NG tube.

EXAM:
PORTABLE ABDOMEN - 1 VIEW

[ap portable]
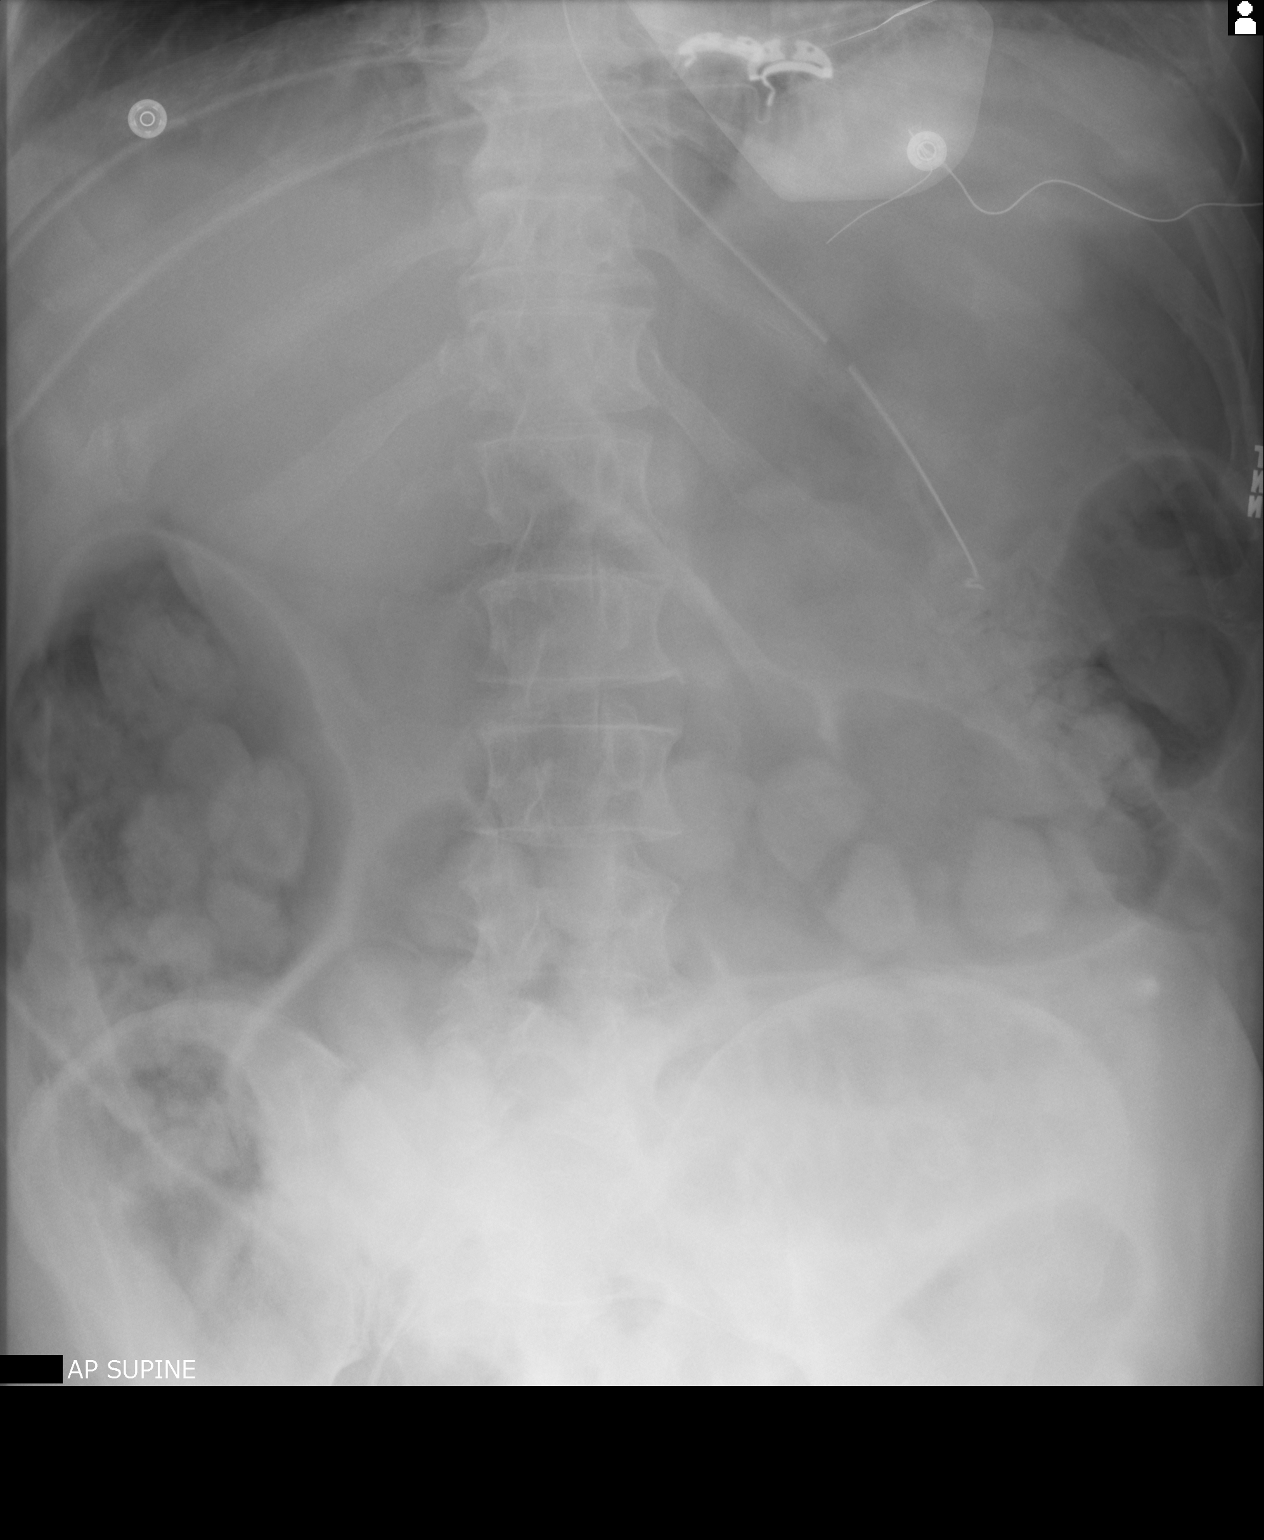

[1 of 1 positions shown; findings below may reference images not displayed]

FINDINGS: The NG tube tip is in the body region of the stomach. Moderate stool
noted in the colon.
IMPRESSION: NG tube tip is in the body region of the stomach.

## 2018-04-05 IMAGING — US US RENAL
1 series · 14 of 25 positions shown · non-contrast
Comparison: None.

CLINICAL DATA: Acute kidney injury. Nausea and vomiting for 2 days.

EXAM:
RENAL / URINARY TRACT ULTRASOUND COMPLETE

[Series 1: us renal · 0.25mm/px · 14 of 27 slices shown]
[im 1/27]
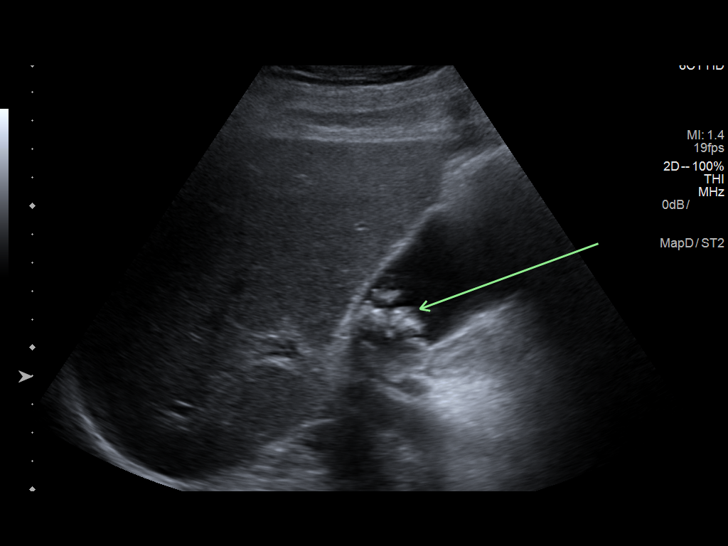
[im 3/27]
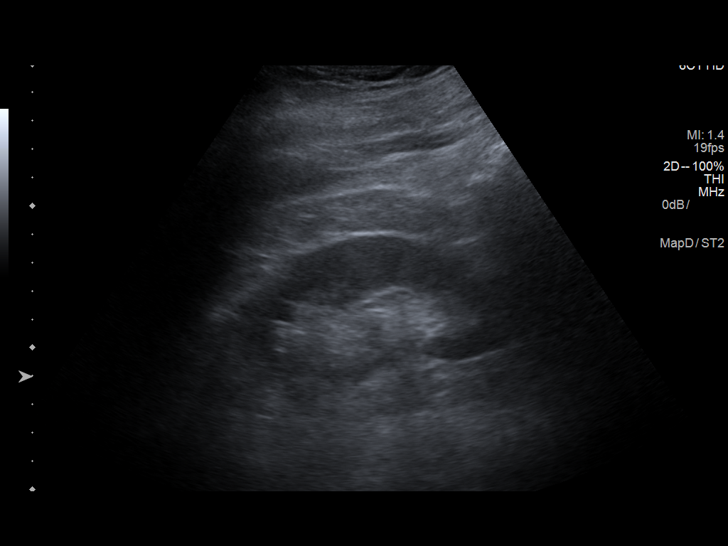
[im 5/27]
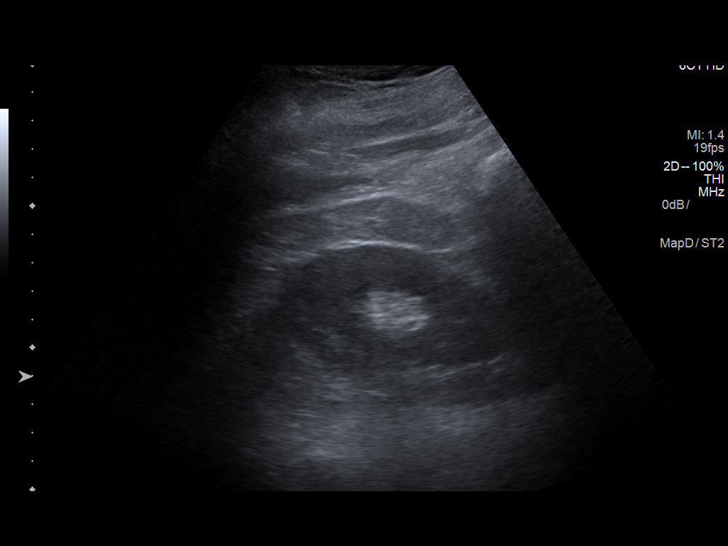
[im 7/27]
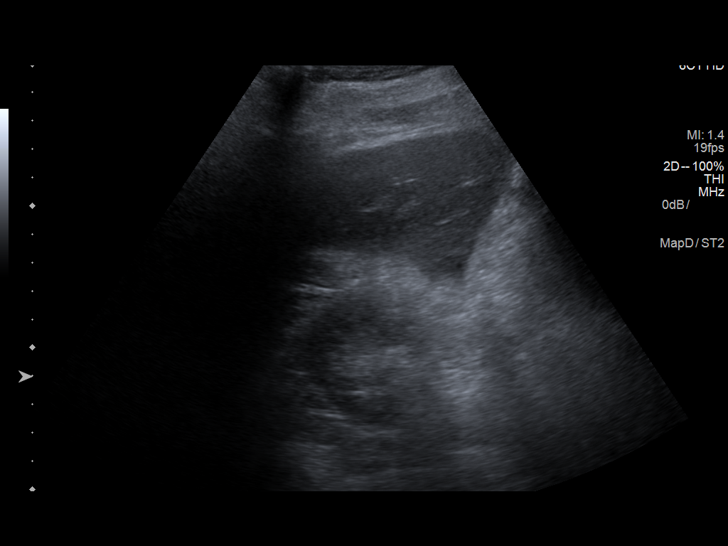
[im 9/27]
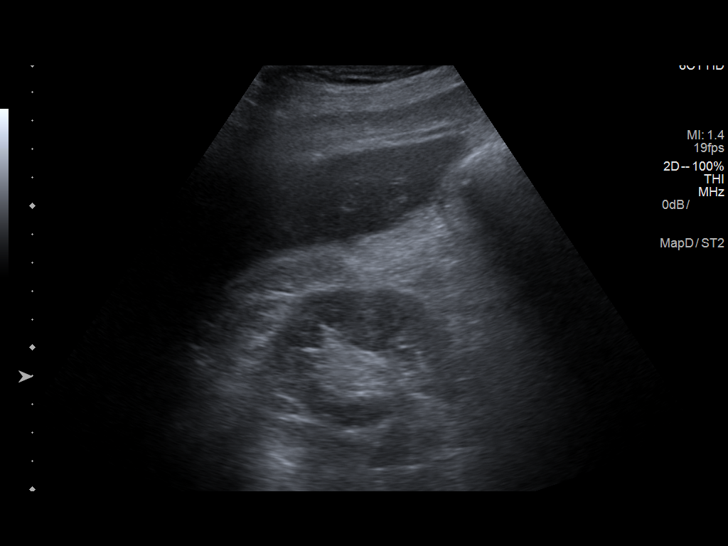
[im 10/27]
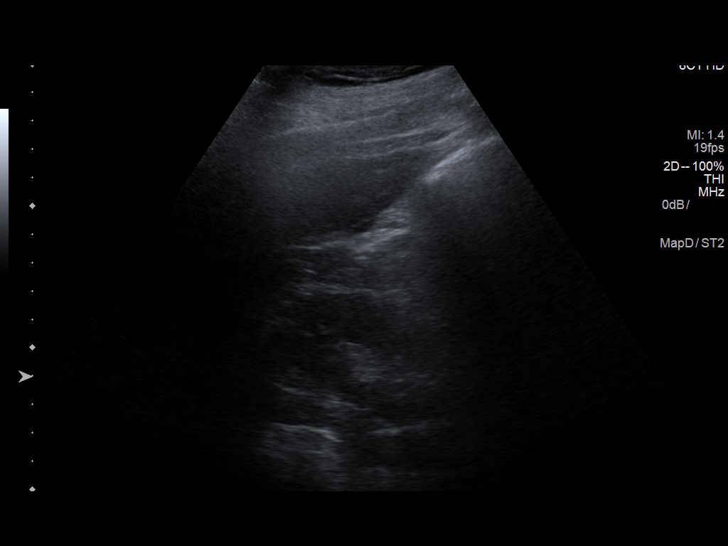
[im 12/27]
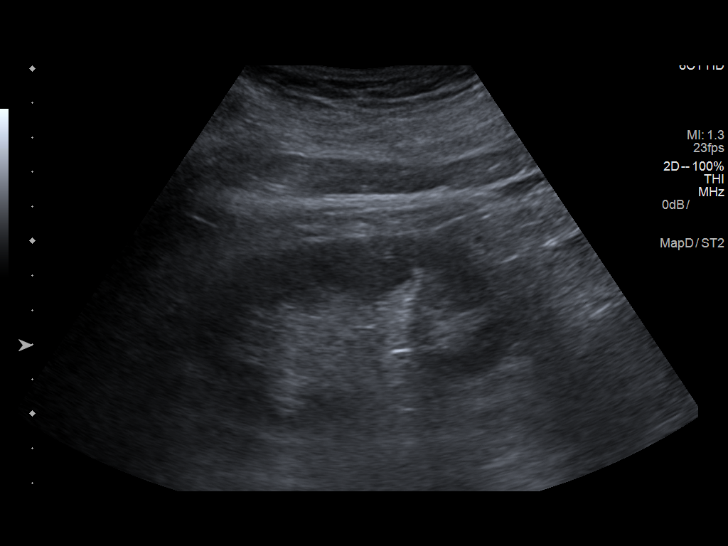
[im 15/27]
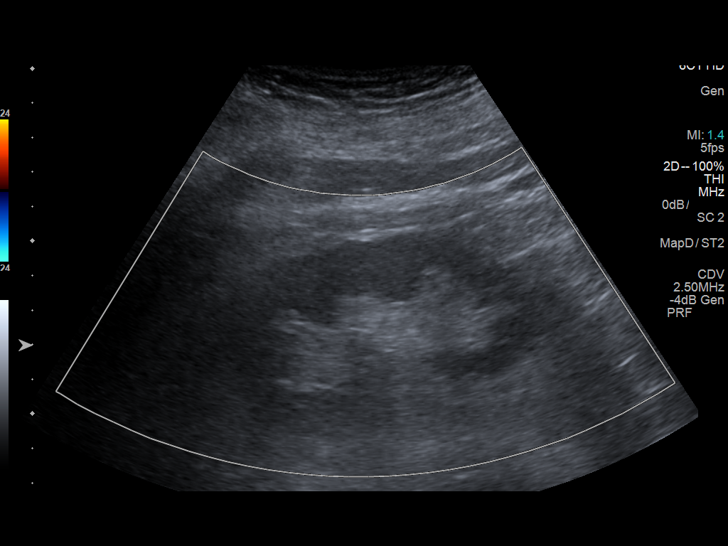
[im 17/27]
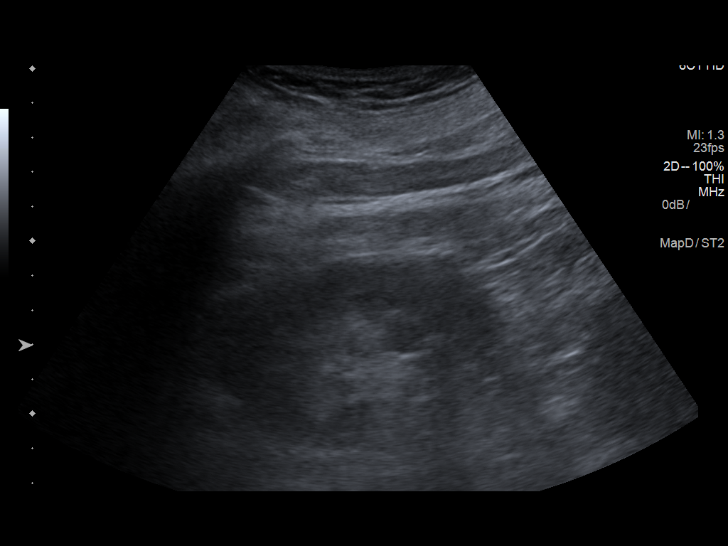
[im 18/27]
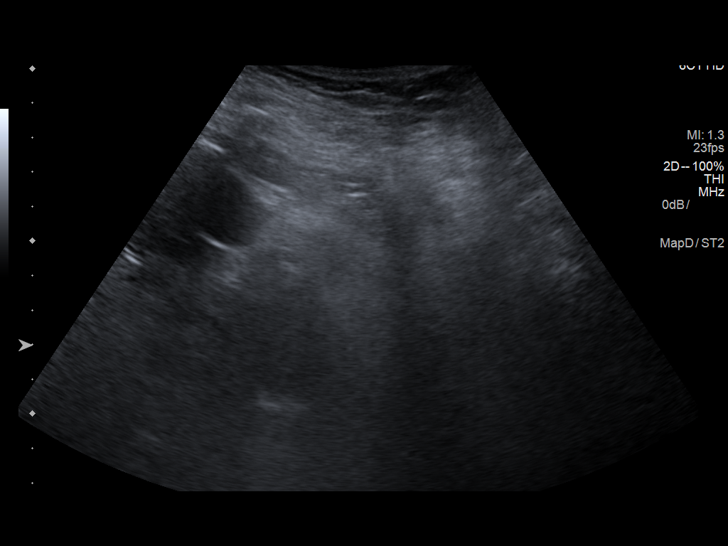
[im 20/27]
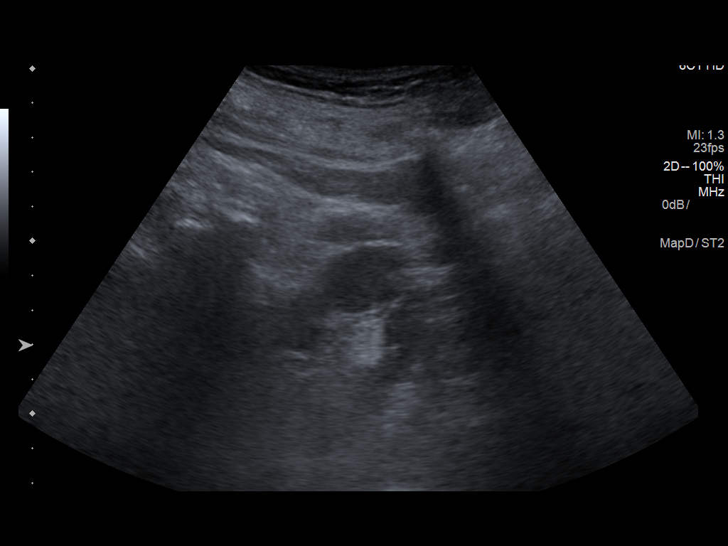
[im 22/27]
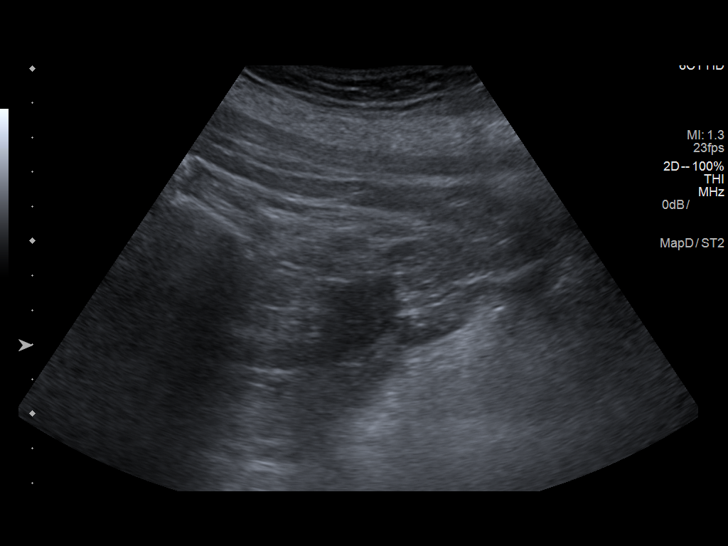
[im 24/27]
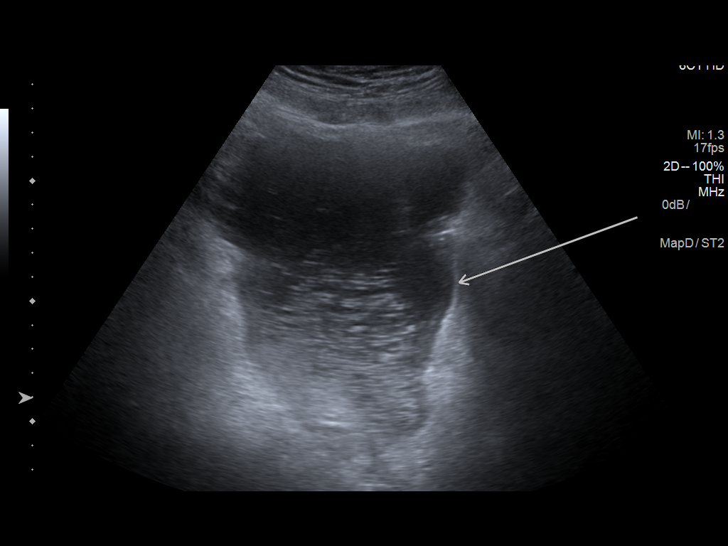
[im 27/27]
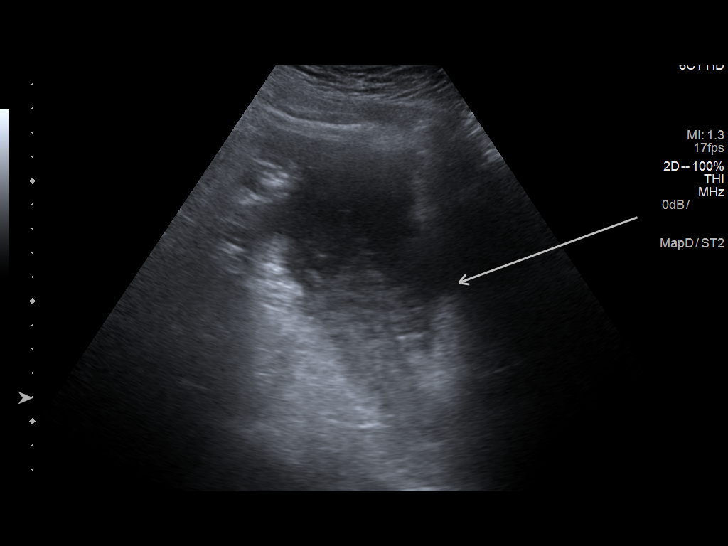

[14 of 25 positions shown; findings below may reference images not displayed]

FINDINGS: Right Kidney:

Length: 10.4 cm. Echogenicity within normal limits. No mass or
hydronephrosis visualized.

Left Kidney:

Length: 9.4 cm. Echogenicity within normal limits. No mass or
hydronephrosis visualized.

Bladder:

Appears normal for degree of bladder distention.

Other:

Multiple stones are seen within the gallbladder.
IMPRESSION: Complex appearing pelvic fluid of indeterminate location may be
within the urinary bladder. Recommend correlation with urinalysis.
CT scan abdomen and pelvis could also be used for further
evaluation.

Negative for hydronephrosis.

Gallstones.
